# Patient Record
Sex: Female | Born: 1955 | Race: Black or African American | Hispanic: No | Smoking: Current every day smoker
Health system: Southern US, Community
[De-identification: ages and names within clinical notes are randomized; demographics above are authoritative.]

## PROBLEM LIST (undated history)

## (undated) ENCOUNTER — Emergency Department (HOSPITAL_COMMUNITY): Admission: EM | Payer: PRIVATE HEALTH INSURANCE | Source: Home / Self Care

## (undated) DIAGNOSIS — I10 Essential (primary) hypertension: Secondary | ICD-10-CM

## (undated) DIAGNOSIS — K219 Gastro-esophageal reflux disease without esophagitis: Secondary | ICD-10-CM

## (undated) DIAGNOSIS — E7800 Pure hypercholesterolemia, unspecified: Secondary | ICD-10-CM

## (undated) HISTORY — PX: KNEE ARTHROSCOPY: SHX127

---

## 2002-06-18 ENCOUNTER — Encounter: Payer: Self-pay | Admitting: Emergency Medicine

## 2002-06-18 ENCOUNTER — Emergency Department (HOSPITAL_COMMUNITY): Admission: EM | Admit: 2002-06-18 | Discharge: 2002-06-18 | Payer: Self-pay | Admitting: Emergency Medicine

## 2004-03-17 ENCOUNTER — Emergency Department (HOSPITAL_COMMUNITY): Admission: EM | Admit: 2004-03-17 | Discharge: 2004-03-18 | Payer: Self-pay

## 2005-11-24 ENCOUNTER — Emergency Department (HOSPITAL_COMMUNITY): Admission: EM | Admit: 2005-11-24 | Discharge: 2005-11-24 | Payer: Self-pay | Admitting: Emergency Medicine

## 2006-01-20 ENCOUNTER — Ambulatory Visit (HOSPITAL_COMMUNITY): Admission: RE | Admit: 2006-01-20 | Discharge: 2006-01-20 | Payer: Self-pay | Admitting: Family Medicine

## 2009-03-03 ENCOUNTER — Other Ambulatory Visit: Payer: Self-pay | Admitting: Emergency Medicine

## 2009-03-03 ENCOUNTER — Inpatient Hospital Stay (HOSPITAL_COMMUNITY): Admission: EM | Admit: 2009-03-03 | Discharge: 2009-03-07 | Payer: Self-pay | Admitting: Cardiology

## 2009-03-06 LAB — HEMOGLOBIN A1C: Hemoglobin A1C: 7.3

## 2011-03-21 LAB — BASIC METABOLIC PANEL
BUN: 5 mg/dL — ABNORMAL LOW (ref 6–23)
CO2: 25 mEq/L (ref 19–32)
CO2: 26 mEq/L (ref 19–32)
Calcium: 9 mg/dL (ref 8.4–10.5)
Calcium: 8.6 mg/dL (ref 8.4–10.5)
Calcium: 8.7 mg/dL (ref 8.4–10.5)
Calcium: 8.9 mg/dL (ref 8.4–10.5)
Chloride: 110 mEq/L (ref 96–112)
Creatinine, Ser: 0.77 mg/dL (ref 0.4–1.2)
Creatinine, Ser: 0.73 mg/dL (ref 0.4–1.2)
Creatinine, Ser: 0.77 mg/dL (ref 0.4–1.2)
GFR calc Af Amer: >60 mL/min (ref >60)
GFR calc Af Amer: >60 mL/min (ref >60)
GFR calc Af Amer: >60 mL/min (ref >60)
GFR calc Af Amer: >60 mL/min (ref >60)
GFR calc non Af Amer: >60 mL/min (ref >60)
GFR calc non Af Amer: >60 mL/min (ref >60)
GFR calc non Af Amer: >60 mL/min (ref >60)
GFR calc non Af Amer: >60 mL/min (ref >60)
Glucose, Bld: 100 mg/dL — ABNORMAL HIGH (ref 70–99)
Glucose, Bld: 104 mg/dL — ABNORMAL HIGH (ref 70–99)
Glucose, Bld: 111 mg/dL — ABNORMAL HIGH (ref 70–99)
Glucose, Bld: 116 mg/dL — ABNORMAL HIGH (ref 70–99)
Potassium: 3.6 mEq/L (ref 3.5–5.1)
Potassium: 3.7 mEq/L (ref 3.5–5.1)
Sodium: 140 mEq/L (ref 135–145)
Sodium: 135 mEq/L (ref 135–145)
Sodium: 140 mEq/L (ref 135–145)

## 2011-03-21 LAB — CBC
Hemoglobin: 13.4 g/dL (ref 12.0–15.0)
Hemoglobin: 11.9 g/dL — ABNORMAL LOW (ref 12.0–15.0)
Hemoglobin: 12.4 g/dL (ref 12.0–15.0)
MCHC: 31.9 g/dL (ref 30.0–36.0)
MCHC: 32 g/dL (ref 30.0–36.0)
MCHC: 32.1 g/dL (ref 30.0–36.0)
MCHC: 32.5 g/dL (ref 30.0–36.0)
MCV: 76.7 fL — ABNORMAL LOW (ref 78.0–100.0)
MCV: 77.2 fL — ABNORMAL LOW (ref 78.0–100.0)
MCV: 77.5 fL — ABNORMAL LOW (ref 78.0–100.0)
Platelets: 192 10*3/uL (ref 150–400)
Platelets: 223 10*3/uL (ref 150–400)
Platelets: 235 10*3/uL (ref 150–400)
RBC: 5.44 MIL/uL — ABNORMAL HIGH (ref 3.87–5.11)
RBC: 4.8 MIL/uL (ref 3.87–5.11)
RBC: 5.12 MIL/uL — ABNORMAL HIGH (ref 3.87–5.11)
RDW: 15.3 % (ref 11.5–15.5)
RDW: 15.1 % (ref 11.5–15.5)
RDW: 15.7 % — ABNORMAL HIGH (ref 11.5–15.5)

## 2011-03-21 LAB — COMPREHENSIVE METABOLIC PANEL
ALT: 11 U/L (ref 0–35)
BUN: 7 mg/dL (ref 6–23)
CO2: 29 mEq/L (ref 19–32)
Calcium: 8.5 mg/dL (ref 8.4–10.5)
Creatinine, Ser: 0.71 mg/dL (ref 0.4–1.2)
GFR calc non Af Amer: >60 mL/min (ref >60)
Glucose, Bld: 126 mg/dL — ABNORMAL HIGH (ref 70–99)
Sodium: 139 mEq/L (ref 135–145)
Total Protein: 6.6 g/dL (ref 6.0–8.3)

## 2011-03-21 LAB — DIFFERENTIAL
Basophils Absolute: 0.1 10*3/uL (ref 0.0–0.1)
Basophils Relative: 1 % (ref 0–1)
Lymphocytes Relative: 49 % — ABNORMAL HIGH (ref 12–46)
Lymphs Abs: 3.3 10*3/uL (ref 0.7–4.0)
Monocytes Absolute: 0.5 10*3/uL (ref 0.1–1.0)
Monocytes Absolute: 0.5 10*3/uL (ref 0.1–1.0)
Monocytes Relative: 6 % (ref 3–12)
Monocytes Relative: 8 % (ref 3–12)
Neutro Abs: 3.3 10*3/uL (ref 1.7–7.7)
Neutro Abs: 2.9 10*3/uL (ref 1.7–7.7)
Neutrophils Relative %: 42 % — ABNORMAL LOW (ref 43–77)
Neutrophils Relative %: 42 % — ABNORMAL LOW (ref 43–77)

## 2011-03-21 LAB — POCT CARDIAC MARKERS
CKMB, poc: 1.8 ng/mL (ref 1.0–8.0)
Myoglobin, poc: 149 ng/mL (ref 12–200)

## 2011-03-21 LAB — CARDIAC PANEL(CRET KIN+CKTOT+MB+TROPI)
CK, MB: 1.5 ng/mL (ref 0.3–4.0)
Total CK: 143 U/L (ref 7–177)
Troponin I: <0.01 ng/mL (ref 0.00–0.06)

## 2011-03-21 LAB — HEMOGLOBIN A1C
Hgb A1c MFr Bld: 7.3 % — ABNORMAL HIGH (ref 4.6–6.1)
Mean Plasma Glucose: 163 mg/dL

## 2011-03-21 LAB — TROPONIN I: Troponin I: 0.01 ng/mL (ref 0.00–0.06)

## 2011-03-21 LAB — GLUCOSE, CAPILLARY: Glucose-Capillary: 126 mg/dL — ABNORMAL HIGH (ref 70–99)

## 2011-03-21 LAB — IRON AND TIBC: UIBC: 261 ug/dL

## 2011-03-21 LAB — TSH: TSH: 0.597 u[IU]/mL (ref 0.350–4.500)

## 2011-03-21 LAB — CK TOTAL AND CKMB (NOT AT ARMC)
CK, MB: 3.3 ng/mL (ref 0.3–4.0)
Total CK: 285 U/L — ABNORMAL HIGH (ref 7–177)

## 2011-03-21 LAB — PROTIME-INR
INR: 1.1 (ref 0.00–1.49)
Prothrombin Time: 14.2 seconds (ref 11.6–15.2)

## 2011-03-21 LAB — HEPARIN LEVEL (UNFRACTIONATED): Heparin Unfractionated: 0.26 IU/mL — ABNORMAL LOW (ref 0.30–0.70)

## 2011-03-21 LAB — LIPID PANEL
Cholesterol: 154 mg/dL (ref 0–200)
LDL Cholesterol: 106 mg/dL — ABNORMAL HIGH (ref 0–99)

## 2011-03-21 LAB — APTT: aPTT: 60 seconds — ABNORMAL HIGH (ref 24–37)

## 2011-04-23 NOTE — Cardiovascular Report (Signed)
[name], ARBAUGH NO.:  [id number]   MEDICAL RECORD NO.:  [id number]          PATIENT TYPE:  INP   LOCATION:  3739                         FACILITY:  [facility]   PHYSICIAN:  Nicki Guadalajara, M.D.     DATE OF BIRTH:  22-Apr-1956   DATE OF PROCEDURE:  DATE OF DISCHARGE:                            CARDIAC CATHETERIZATION   INDICATIONS:  Ms. Breanne Olvera is a 55 year old African American female  who has a history of hypertension, tobacco history, and significant  family history for coronary artery disease.  She was admitted to [facility] on transfer from [facility] with chest pain.  She  was admitted by Dr. Garen Lah in light of her chest pain symptomatology  with significant cardiac risk factors and definitive cardiac  catheterization was recommended.   PROCEDURE:  After premedication with Valium and Benadryl on the floor,  the patient was prepped and draped in the usual fashion.  The right  femoral artery was punctured anteriorly and a 5-French arterial sheath  was inserted without difficulty.  Diagnostic cardiac catheterization was  done utilizing 5-French Judkins for left and right coronary catheters.  A 5-French pigtail catheter was used for RAO ventriculography.  Distal  aortography was also performed to evaluate her renal artery to make sure  she did not have any renovascular etiology to her hypertension.  Hemostasis was obtained by direct manual pressure.  She tolerated the  procedure well.   HEMODYNAMIC DATA:  Central aortic pressure was 154/69.  Left ventricular  pressure 154/19.   ANGIOGRAPHIC DATA:  The left main coronary artery was angiographically  normal and bifurcated into the LAD and left circumflex system.   The LAD was angiographically normal and gave rise to two diagonal  vessels and wrapped around the LV apex.   The circumflex vessel had mild 20% narrowing just proximal to giving  rise to the OM1 vessel.   The right coronary  artery was a moderate-sized vessel that gave rise to  a PDA and PLA system.  There was mild 20% narrowing in the mid segment.   RAO ventriculography revealed normal LV contractility without focal  segmental wall motion abnormalities.  There was no evidence for mitral  regurgitation.   Distal aortography revealed a mildly tortuous infrarenal aorta.  There  was no evidence for stenosis.  The renal arteries were widely patent.  She otherwise has normal aortoiliac system.   IMPRESSION:  1. Normal left ventricular function.  2. Mild nonobstructive coronary artery disease with a smooth 20%      narrowing in the proximal circumflex and smooth 20% narrowing in      the mid right coronary artery.   RECOMMENDATIONS:  Medical therapy with reference to aggressive lifestyle  and cardiac risk factor modification with particular importance to  smoking cessation.           ______________________________  Nicki Guadalajara, M.D.     TK/MEDQ  D:  03/06/2009  T:  03/07/2009  Job:  [id number]   cc:   Sheliah Mends, MD  [facility]

## 2011-04-23 NOTE — Discharge Summary (Signed)
NAMEJAYLIANI, WANNER NO.:  [id number]   MEDICAL RECORD NO.:  [id number]          PATIENT TYPE:  INP   LOCATION:  3739                         FACILITY:  [facility]   PHYSICIAN:  Nicki Guadalajara, M.D.     DATE OF BIRTH:  12/08/1956   DATE OF ADMISSION:  03/03/2009  DATE OF DISCHARGE:  03/07/2009                               DISCHARGE SUMMARY   Ms. [name] is a 55 year old female who apparently was having chest  pain for 2 months off and on.  Her symptoms were worse over the past  week.  She was seen by her primary care doctor to refill her  hydrochlorothiazide for her high blood pressure and she was sent to the  emergency room.  She was seen in the ER by the ER doctors and  transferred to [facility] for further cardiac workup.  It was  felt that her chest pain was worrisome for unstable angina plus she has  hypertension and tobacco smoking and family history of heart disease  prematurely.  She was placed on IV heparin.  Her CK-MBs came back  negative.  Her potassium was very low on admission at 2.5.  This was  repleted.  She underwent cardiac catheterization on March 06, 2009, by  Dr. Tresa Endo.  She had 20% in her RCA and 20% in her mid circumflex.  On  March 07, 2009, she was seen by Dr. Tresa Endo, considered stable for  discharge home.  Her blood pressure was 113/64.  She was sinus brady 46-  50.  She was not given any beta-blockers secondary to this.  Her  __________ was 20.  Her temperature was 97.9.  Her labs showed on the  day of discharge sodium of 141, potassium 3.8, BUN 5, creatinine 0.73,  and glucose was 111.  Her hemoglobin 11.1, hematocrit 34.8, and  platelets were 192, and WBCs 5.5.  CK-MBs and troponins were all  negative.  Her TSH was 0.597.  Hemoglobin A1c was 7.3.  Lipid profile  showed a total cholesterol of 154, triglycerides 111, HDL 26, and LDL is  106.   DISCHARGE MEDICATIONS:  1. Hydrochlorothiazide 25 mg once a day.  2. Pepcid 20 mg  daily.  3. Prevacid 20 mg daily at bedtime.  4. K-Dur 20 mEq a day.  5. Diabetes mellitus, questionable new.  We will refer her back to her      primary care physician.  She states she has been evaluated for this      by her primary care physician.  However, her hemoglobin A1c is now      7.3.  She has not been taught anything about the diet, etc.  Again,      we will refer her back to her primary care physician at [facility].  She was made aware of this.  6. Microcytic anemia.  Her MCV is 76.7.  She probably requires iron      studies.  This can also be done as an outpatient.      Lezlie Octave,  N.P.    ______________________________  Nicki Guadalajara, M.D.    BB/MEDQ  D:  03/07/2009  T:  03/08/2009  Job:  [id number]   cc:   [facility]

## 2011-04-23 NOTE — H&P (Signed)
[name], [name]                ACCOUNT NO.:  [id number]   MEDICAL RECORD NO.:  [id number]          PATIENT TYPE:  INP   LOCATION:  2920                         FACILITY:  [facility]   PHYSICIAN:  Sheliah Mends, MD      DATE OF BIRTH:  Dec 10, 1955   DATE OF ADMISSION:  03/03/2009  DATE OF DISCHARGE:                              HISTORY & PHYSICAL   CHIEF COMPLAINT:  Chest pain.   HISTORY OF PRESENT ILLNESS:  [name] is a 55 year old female who was  transferred from [facility].  She originally presented to her  primary care doctor at [facility].  She needed a  refill on her blood pressure medicine prescription.  She mentioned to  her [name] there that she had been having some substernal chest pain  for about 2 months off and on.  She says her symptoms were worse this  past week.  She describes a substernal chest heaviness which seems to be  worse with exertion.  She does say it radiates to her arm and into her  mid back.  Her primary care [name] sent her to [facility] ER.  Initial  enzymes are negative.  After discussion with Dr. Garen Lah over the  phone, the patient is transferred to [facility].  She still having some minor  amount of pain on heparin and nitroglycerin.  Her EKG shows nonspecific  ST changes.  She denies any associated shortness of breath or  diaphoresis but may have had some associated nausea with this.   PAST MEDICAL HISTORY:  Remarkable for treated hypertension.  She has had  a remote C-section.   CURRENT MEDICATIONS:  1. HCTZ.  2. Aspirin 325 daily.   ALLERGIES:  No known drug allergies.   SOCIAL HISTORY:  She is divorced.  She is three children.  She is a  Scientist, product/process development.  She smokes about a pack a day.   FAMILY HISTORY:  Remarkable that her mother, her sister and 2 brothers  all have coronary disease which came on their 6s.   REVIEW OF SYSTEMS:  Essentially unremarkable except for above.  She  denies any GI bleeding or melena.   She says at one time a physician told  her she had some thyroid issues but follow up by another physician said  that he did not think this was a problem.   PHYSICAL EXAMINATION:  Blood pressure 120/68, pulse 52, respirations 12.  GENERAL:  She is a well-developed, overweight African American female in  no acute distress.  HEENT: Normocephalic, atraumatic.  Extraocular movements intact.  Sclerae not icteric.  Lids and conjunctivae within normal limits.  NECK:  Without JVD or bruit.  CHEST: Clear to auscultation and percussion.  CARDIAC:  Reveals regular rate and rhythm without murmur or gallop.  Normal S1 and  S2.  ABDOMEN:  Obese, nontender.  No hepatosplenomegaly.  No bruits.  EXTREMITIES: Without edema.  Distal pulses are 3+/4 bilaterally.  NEURO:  Exam is grossly intact.  She is awake, alert and oriented,  operative.  Moves all extremities without obvious deficit.  SKIN:  Warm  and dry.   EKG shows sinus rhythm without acute changes.   LABORATORY DATA:  White count 7.9, hemoglobin 13.4, hematocrit 42.2,  platelets 242.  Sodium was 140, potassium was 2.5, BUN 8, creatinine  0.7, troponin is negative.  BNP less than 30.   IMPRESSION:  1. Chest pain with multiple cardiac risk factors, worrisome for      unstable angina.  2. Treated hypertension.  3. Family history of coronary disease.  4. Smoking.  5. Unknown lipid status.  6. Hypokalemia.  I believe her potassium was replaced up at [facility]      but this will be double-check.   PLAN:  Will continue heparin, nitrates and aspirin.  Hold off on a beta  blocker as her heart rate is 52.  Will add a PPI.  She will probably  need diagnostic catheterization.  This will be discussed with Dr.  Garen Lah.      [name], P.A.      Sheliah Mends, MD  Electronically Signed    LKK/MEDQ  D:  03/04/2009  T:  03/04/2009  Job:  [id number]

## 2012-07-16 NOTE — H&P (Signed)
  NTS SOAP Note  Vital Signs:  Vitals as of: 07/16/2012: Systolic 164: Diastolic 95: Heart Rate 67: Temp 97.39F: Height 50ft 9in: Weight 218Lbs 0 Ounces: BMI 32  BMI : 32.19 kg/m2  Subjective: This 56 Years 1 Months old Female presents for rectal issues.  Has had some protrusion of the rectum recently.  Denies bleeding, significant constipation.   Review of Symptoms:  Constitutional:  fatigue Head:unremarkable    Eyes:unremarkable   Nose/Mouth/Throat:unremarkable Cardiovascular:  unremarkable   Respiratory:  wheezing Gastrointestinal:  unremarkable   Genitourinary:unremarkable     Musculoskeletal:unremarkable   Skin:unremarkable Hematolgic/Lymphatic:unremarkable     Allergic/Immunologic:unremarkable     Past Medical History:    Reviewed   Past Medical History  Surgical History: c-section Medical Problems:  Diabetes, High cholesterol Allergies: nkda Medications: metformin, pravastatin, HCTZ, baby asa   Social History:Reviewed  Social History  Preferred Language: English ([place]) Ethnicity: Not Hispanic / Latino Age: 56 Years 7 Months Alcohol:  No Recreational drug(s):  No   Smoking Status: Current every day smoker reviewed on 07/16/2012 Started Date: 12/09/1978 Packs per day: 1.00   Family History:  Reviewed   Family History  Is there a family history of:HTN, DM    Objective Information: General:  Well appearing, well nourished in no distress. Neck:  Supple without lymphadenopathy.  Heart:  RRR, no murmur Lungs:    CTA bilaterally, no wheezes, rhonchi, rales.  Breathing unlabored. Abdomen:Soft, NT/ND, no HSM, no masses.   Circumferential prolapse of mucosa noted.  No specific mass.  Tone fair.  Heme negative.  Assessment:Rectal prolapse  Diagnosis &amp; Procedure: DiagnosisCode: 569.1, ProcedureCode: 09811,    Plan:Scheduled for TCS on 07/24/12.  Needs screening TCS.  Will  manage prolapse pending TCS results.   Patient Education:Alternative treatments to surgery were discussed with patient (and family).  Risks and benefits  of procedure were fully explained to the patient (and family) who gave informed consent. Patient/family questions were addressed.  Follow-up:Pending Surgery

## 2012-07-17 ENCOUNTER — Encounter (HOSPITAL_COMMUNITY): Payer: Self-pay | Admitting: Pharmacy Technician

## 2012-07-24 ENCOUNTER — Encounter (HOSPITAL_COMMUNITY)
Admission: RE | Disposition: A | Discharge status: Home / Self Care | Payer: Self-pay | Source: Ambulatory Visit | Attending: General Surgery

## 2012-07-24 ENCOUNTER — Encounter (HOSPITAL_COMMUNITY): Payer: Self-pay

## 2012-07-24 ENCOUNTER — Ambulatory Visit (HOSPITAL_COMMUNITY)
Admission: RE | Admit: 2012-07-24 | Discharge: 2012-07-24 | Disposition: A | Discharge status: Home / Self Care | Payer: PRIVATE HEALTH INSURANCE | Source: Ambulatory Visit | Attending: General Surgery | Admitting: General Surgery

## 2012-07-24 DIAGNOSIS — E119 Type 2 diabetes mellitus without complications: Secondary | ICD-10-CM | POA: Insufficient documentation

## 2012-07-24 DIAGNOSIS — K644 Residual hemorrhoidal skin tags: Secondary | ICD-10-CM | POA: Insufficient documentation

## 2012-07-24 DIAGNOSIS — Z1211 Encounter for screening for malignant neoplasm of colon: Secondary | ICD-10-CM | POA: Insufficient documentation

## 2012-07-24 DIAGNOSIS — E7800 Pure hypercholesterolemia, unspecified: Secondary | ICD-10-CM | POA: Insufficient documentation

## 2012-07-24 DIAGNOSIS — Z01812 Encounter for preprocedural laboratory examination: Secondary | ICD-10-CM | POA: Insufficient documentation

## 2012-07-24 HISTORY — DX: Gastro-esophageal reflux disease without esophagitis: K21.9

## 2012-07-24 HISTORY — DX: Pure hypercholesterolemia, unspecified: E78.00

## 2012-07-24 HISTORY — PX: COLONOSCOPY: SHX5424

## 2012-07-24 HISTORY — DX: Essential (primary) hypertension: I10

## 2012-07-24 SURGERY — COLONOSCOPY
Anesthesia: Moderate Sedation

## 2012-07-24 MED ORDER — MEPERIDINE HCL 50 MG/ML IJ SOLN
INTRAMUSCULAR | Status: AC
  Filled 2012-07-24: qty 1

## 2012-07-24 MED ORDER — SODIUM CHLORIDE 0.9 % IV SOLN
INTRAVENOUS | Status: DC
  Administered 2012-07-24: 07:00:00 via INTRAVENOUS

## 2012-07-24 MED ORDER — MIDAZOLAM HCL 5 MG/5ML IJ SOLN
INTRAMUSCULAR | Status: DC | PRN
  Administered 2012-07-24: 1 mg via INTRAVENOUS
  Administered 2012-07-24: 3 mg via INTRAVENOUS

## 2012-07-24 MED ORDER — MIDAZOLAM HCL 5 MG/5ML IJ SOLN
INTRAMUSCULAR | Status: AC
  Filled 2012-07-24: qty 10

## 2012-07-24 MED ORDER — MEPERIDINE HCL 25 MG/ML IJ SOLN
INTRAMUSCULAR | Status: DC | PRN
  Administered 2012-07-24: 50 mg via INTRAVENOUS

## 2012-07-24 MED ORDER — STERILE WATER FOR IRRIGATION IR SOLN
Status: DC | PRN
  Administered 2012-07-24: 08:00:00

## 2012-07-24 NOTE — Op Note (Signed)
[facility] [address] [place], [place]  [address]  COLONOSCOPY PROCEDURE REPORT  PATIENT:  [name], [name]  MR#:  [id number] BIRTHDATE:  1956-06-19, 55 yrs. old  GENDER:  female ENDOSCOPIST:  Franky Macho, MD REF. BY:  Reynolds Bowl, M.D. PROCEDURE DATE:  07/24/2012 PROCEDURE:  Average-risk screening colonoscopy G0121 ASA CLASS:  Class II INDICATIONS:  Screening MEDICATIONS:   Versed 4 mg IV, demerol 50 mg IV  DESCRIPTION OF PROCEDURE:   After the risks benefits and alternatives of the procedure were thoroughly explained, informed consent was obtained.  Digital rectal exam was performed and revealed rectal prolapse.   The EC-[id number] ([id number]) endoscope was introduced through the anus and advanced to the cecum, which was identified by both the appendix and ileocecal valve, without limitations.  The quality of the prep was adequate..  The instrument was then slowly withdrawn as the colon was fully examined.  FINDINGS:  A normal appearing cecum, ileocecal valve, and appendiceal orifice were identified. The ascending, hepatic flexure, transverse, splenic flexure, descending, sigmoid colon, and rectum appeared unremarkable.   Retroflexed views in the rectum revealed internal and external hemorrhoids.    The time to cecum =  1) 10  minutes. The scope was then withdrawn in  1) 4 minutes from the cecum and the procedure completed. COMPLICATIONS:  None ENDOSCOPIC IMPRESSION: 1) Normal colon 2) Internal and external hemorrhoids RECOMMENDATIONS:  May need surgery for rectal prolapse.  Will discuss with patient. REPEAT EXAM:  In 10 year(s) for Colonoscopy.  ______________________________ Franky Macho, MD  CC:  Reynolds Bowl MD  n. Rosalie DoctorFranky Macho at 07/24/2012 08:01 AM  Delman Kitten, [phone]

## 2012-07-24 NOTE — Interval H&P Note (Signed)
History and Physical Interval Note:  07/24/2012 7:22 AM  [name]  has presented today for surgery, with the diagnosis of Rectal bleed   The various methods of treatment have been discussed with the patient and family. After consideration of risks, benefits and other options for treatment, the patient has consented to  Procedure(s) (LRB): COLONOSCOPY (N/A) as a surgical intervention .  The patient's history has been reviewed, patient examined, no change in status, stable for surgery.  I have reviewed the patient's chart and labs.  Questions were answered to the patient's satisfaction.     Franky Macho A

## 2012-07-30 ENCOUNTER — Encounter (HOSPITAL_COMMUNITY): Payer: Self-pay | Admitting: General Surgery

## 2012-09-03 NOTE — H&P (Signed)
  NTS SOAP Note  Vital Signs:  Vitals as of: 09/04/2012: Systolic 164: Diastolic 95: Heart Rate 67: Temp 97.13F: Height 32ft 9in: Weight 218Lbs 0 Ounces: BMI 32  BMI : 32.19 kg/m2  Subjective: This 58 Years 36 Months old Female presents for rectal issues.  Has had some protrusion of the rectum recently.  Denies bleeding, significant constipation.   Review of Symptoms:  Constitutional:  fatigue Head:unremarkable    Eyes:unremarkable   Nose/Mouth/Throat:unremarkable Cardiovascular:  unremarkable   Respiratory:  wheezing Gastrointestinal:  unremarkable   Genitourinary:unremarkable     Musculoskeletal:unremarkable   Skin:unremarkable Hematolgic/Lymphatic:unremarkable     Allergic/Immunologic:unremarkable     Past Medical History:    Reviewed   Past Medical History  Surgical History: c-section Medical Problems:  Diabetes, High cholesterol Allergies: nkda Medications: metformin, pravastatin, HCTZ, baby asa   Social History:Reviewed  Social History  Preferred Language: English ([place]) Ethnicity: Not Hispanic / Latino Age: 35 Years 7 Months Alcohol:  No Recreational drug(s):  No   Smoking Status: Current every day smoker reviewed on 07/16/2012 Started Date: 12/09/1978 Packs per day: 1.00   Family History:  Reviewed   Family History  Is there a family history of:HTN, DM    Objective Information: General:  Well appearing, well nourished in no distress. Neck:  Supple without lymphadenopathy.  Heart:  RRR, no murmur Lungs:    CTA bilaterally, no wheezes, rhonchi, rales.  Breathing unlabored. Abdomen:Soft, NT/ND, no HSM, no masses.   Circumferential prolapse of mucosa noted.  No specific mass.  Tone fair.  Heme negative.  Assessment:Rectal prolapse   Plan:Scheduled for proctoplasty on 09/18/12.  Patient Education:Alternative treatments to surgery were discussed with patient (and  family).  Risks and benefits  of procedure were fully explained to the patient (and family) who gave informed consent. Patient/family questions were addressed.  Follow-up:Pending Surgery

## 2012-09-10 NOTE — Patient Instructions (Addendum)
20 BIJAL SIGLIN  09/10/2012   Your procedure is scheduled on:  Friday, 09/18/12  [facility] to [facility] [place] [facility].  Call this number if you have problems the morning of surgery: [phone]   Remember:   Do not eat food:After Midnight.  May have clear liquids:until Midnight .  Clear liquids include soda, tea, black coffee, apple or grape juice, broth.  Take these medicines the morning of surgery with A SIP OF WATER:    Do not wear jewelry, make-up or nail polish.  Do not wear lotions, powders, or perfumes. You may wear deodorant.  Do not shave 48 hours prior to surgery. Men may shave face and neck.  Do not bring valuables to the hospital.  Contacts, dentures or bridgework may not be worn into surgery.  Leave suitcase in the car. After surgery it may be brought to your room.  For patients admitted to the hospital, checkout time is 11:00 AM the day of discharge.   Patients discharged the day of surgery will not be allowed to drive home.  Name and phone number of your driver: family  Special Instructions: Shower using CHG 2 nights before surgery and the night before surgery.  If you shower the day of surgery use CHG.  Use special wash - you have one bottle of CHG for all showers.  You should use approximately 1/3 of the bottle for each shower.   Please read over the following fact sheets that you were given: Pain Booklet, MRSA Information, Surgical Site Infection Prevention, Anesthesia Post-op Instructions and Care and Recovery After Surgery  PATIENT INSTRUCTIONS POST-ANESTHESIA  IMMEDIATELY FOLLOWING SURGERY:  Do not drive or operate machinery for the first twenty four hours after surgery.  Do not make any important decisions for twenty four hours after surgery or while taking narcotic pain medications or sedatives.  If you develop intractable nausea and vomiting or a severe headache please notify your doctor immediately.  FOLLOW-UP:  Please make an appointment with your surgeon as  instructed. You do not need to follow up with anesthesia unless specifically instructed to do so.  WOUND CARE INSTRUCTIONS (if applicable):  Keep a dry clean dressing on the anesthesia/puncture wound site if there is drainage.  Once the wound has quit draining you may leave it open to air.  Generally you should leave the bandage intact for twenty four hours unless there is drainage.  If the epidural site drains for more than 36-48 hours please call the anesthesia department.  QUESTIONS?:  Please feel free to call your physician or the hospital operator if you have any questions, and they will be happy to assist you.

## 2012-09-11 ENCOUNTER — Encounter (HOSPITAL_COMMUNITY): Payer: Self-pay

## 2012-09-11 ENCOUNTER — Encounter (HOSPITAL_COMMUNITY)
Admission: RE | Admit: 2012-09-11 | Discharge: 2012-09-11 | Disposition: A | Discharge status: Home / Self Care | Payer: PRIVATE HEALTH INSURANCE | Source: Ambulatory Visit | Attending: General Surgery | Admitting: General Surgery

## 2012-09-11 ENCOUNTER — Encounter (HOSPITAL_COMMUNITY): Payer: Self-pay | Admitting: Pharmacy Technician

## 2012-09-11 LAB — CBC WITH DIFFERENTIAL/PLATELET
Basophils Relative: 0 % (ref 0–1)
Eosinophils Relative: 2 % (ref 0–5)
Hemoglobin: 13.7 g/dL (ref 12.0–15.0)
Lymphs Abs: 3.4 10*3/uL (ref 0.7–4.0)
MCH: 24.7 pg — ABNORMAL LOW (ref 26.0–34.0)
MCV: 74.9 fL — ABNORMAL LOW (ref 78.0–100.0)
Monocytes Absolute: 0.4 10*3/uL (ref 0.1–1.0)
Neutrophils Relative %: 41 % — ABNORMAL LOW (ref 43–77)
RBC: 5.54 MIL/uL — ABNORMAL HIGH (ref 3.87–5.11)

## 2012-09-11 LAB — BASIC METABOLIC PANEL
CO2: 30 mEq/L (ref 19–32)
Glucose, Bld: 123 mg/dL — ABNORMAL HIGH (ref 70–99)
Potassium: 3.6 mEq/L (ref 3.5–5.1)
Sodium: 139 mEq/L (ref 135–145)

## 2012-09-18 ENCOUNTER — Encounter (HOSPITAL_COMMUNITY)
Admission: RE | Disposition: A | Discharge status: Home / Self Care | Payer: Self-pay | Source: Ambulatory Visit | Attending: General Surgery

## 2012-09-18 ENCOUNTER — Ambulatory Visit (HOSPITAL_COMMUNITY): Payer: PRIVATE HEALTH INSURANCE | Admitting: Anesthesiology

## 2012-09-18 ENCOUNTER — Encounter (HOSPITAL_COMMUNITY): Payer: Self-pay | Admitting: Anesthesiology

## 2012-09-18 ENCOUNTER — Ambulatory Visit (HOSPITAL_COMMUNITY)
Admission: RE | Admit: 2012-09-18 | Discharge: 2012-09-18 | Disposition: A | Discharge status: Home / Self Care | Payer: PRIVATE HEALTH INSURANCE | Source: Ambulatory Visit | Attending: General Surgery | Admitting: General Surgery

## 2012-09-18 ENCOUNTER — Encounter (HOSPITAL_COMMUNITY): Payer: Self-pay | Admitting: *Deleted

## 2012-09-18 DIAGNOSIS — E119 Type 2 diabetes mellitus without complications: Secondary | ICD-10-CM | POA: Insufficient documentation

## 2012-09-18 DIAGNOSIS — E785 Hyperlipidemia, unspecified: Secondary | ICD-10-CM | POA: Insufficient documentation

## 2012-09-18 DIAGNOSIS — K623 Rectal prolapse: Secondary | ICD-10-CM | POA: Insufficient documentation

## 2012-09-18 DIAGNOSIS — Z01812 Encounter for preprocedural laboratory examination: Secondary | ICD-10-CM | POA: Insufficient documentation

## 2012-09-18 DIAGNOSIS — I10 Essential (primary) hypertension: Secondary | ICD-10-CM | POA: Insufficient documentation

## 2012-09-18 LAB — GLUCOSE, CAPILLARY
Glucose-Capillary: 154 mg/dL — ABNORMAL HIGH (ref 70–99)
Glucose-Capillary: 124 mg/dL — ABNORMAL HIGH (ref 70–99)

## 2012-09-18 SURGERY — PROCTOPLASTY
Anesthesia: General | Wound class: Clean Contaminated

## 2012-09-18 MED ORDER — ENOXAPARIN SODIUM 40 MG/0.4ML ~~LOC~~ SOLN
SUBCUTANEOUS | Status: AC
  Filled 2012-09-18: qty 0.4

## 2012-09-18 MED ORDER — PROMETHAZINE HCL 25 MG/ML IJ SOLN
INTRAMUSCULAR | Status: AC
  Filled 2012-09-18: qty 1

## 2012-09-18 MED ORDER — HYDROCODONE-ACETAMINOPHEN 5-325 MG PO TABS: 1.0000 | ORAL_TABLET | ORAL | Status: DC | PRN

## 2012-09-18 MED ORDER — LIDOCAINE VISCOUS 2 % MT SOLN
OROMUCOSAL | Status: AC
  Filled 2012-09-18: qty 15

## 2012-09-18 MED ORDER — FENTANYL CITRATE 0.05 MG/ML IJ SOLN
INTRAMUSCULAR | Status: DC | PRN
  Administered 2012-09-18 (×5): 50 ug via INTRAVENOUS

## 2012-09-18 MED ORDER — KETOROLAC TROMETHAMINE 30 MG/ML IJ SOLN
INTRAMUSCULAR | Status: AC
  Filled 2012-09-18: qty 1

## 2012-09-18 MED ORDER — MIDAZOLAM HCL 2 MG/2ML IJ SOLN
1.0000 mg | INTRAMUSCULAR | Status: DC | PRN
  Administered 2012-09-18: 2 mg via INTRAVENOUS

## 2012-09-18 MED ORDER — PROMETHAZINE HCL 25 MG/ML IJ SOLN: 12.5000 mg | Freq: Once | INTRAMUSCULAR | Status: DC

## 2012-09-18 MED ORDER — LIDOCAINE HCL 1 % IJ SOLN
INTRAMUSCULAR | Status: DC | PRN
  Administered 2012-09-18: 40 mg via INTRADERMAL

## 2012-09-18 MED ORDER — ROCURONIUM BROMIDE 100 MG/10ML IV SOLN
INTRAVENOUS | Status: DC | PRN
  Administered 2012-09-18: 5 mg via INTRAVENOUS
  Administered 2012-09-18: 20 mg via INTRAVENOUS

## 2012-09-18 MED ORDER — BUPIVACAINE HCL (PF) 0.5 % IJ SOLN
INTRAMUSCULAR | Status: AC
  Filled 2012-09-18: qty 30

## 2012-09-18 MED ORDER — ONDANSETRON HCL 4 MG/2ML IJ SOLN
4.0000 mg | Freq: Once | INTRAMUSCULAR | Status: AC | PRN
  Administered 2012-09-18: 4 mg via INTRAVENOUS

## 2012-09-18 MED ORDER — ONDANSETRON HCL 4 MG/2ML IJ SOLN
4.0000 mg | Freq: Once | INTRAMUSCULAR | Status: AC
  Administered 2012-09-18: 4 mg via INTRAVENOUS

## 2012-09-18 MED ORDER — GLYCOPYRROLATE 0.2 MG/ML IJ SOLN
INTRAMUSCULAR | Status: DC | PRN
  Administered 2012-09-18: .4 mg via INTRAVENOUS

## 2012-09-18 MED ORDER — LACTATED RINGERS IV SOLN
INTRAVENOUS | Status: DC
  Administered 2012-09-18: 1000 mL via INTRAVENOUS

## 2012-09-18 MED ORDER — PROMETHAZINE HCL 25 MG/ML IJ SOLN
12.5000 mg | Freq: Once | INTRAMUSCULAR | Status: AC
  Administered 2012-09-18: 6.25 mg via INTRAVENOUS

## 2012-09-18 MED ORDER — NEOSTIGMINE METHYLSULFATE 1 MG/ML IJ SOLN
INTRAMUSCULAR | Status: DC | PRN
  Administered 2012-09-18: 2 mg via INTRAVENOUS

## 2012-09-18 MED ORDER — FENTANYL CITRATE 0.05 MG/ML IJ SOLN
25.0000 ug | INTRAMUSCULAR | Status: DC | PRN
  Administered 2012-09-18 (×4): 50 ug via INTRAVENOUS

## 2012-09-18 MED ORDER — SODIUM CHLORIDE 0.9 % IV SOLN
INTRAVENOUS | Status: AC
  Filled 2012-09-18: qty 1

## 2012-09-18 MED ORDER — ONDANSETRON HCL 4 MG/2ML IJ SOLN
INTRAMUSCULAR | Status: AC
  Filled 2012-09-18: qty 2

## 2012-09-18 MED ORDER — FENTANYL CITRATE 0.05 MG/ML IJ SOLN
INTRAMUSCULAR | Status: AC
  Filled 2012-09-18: qty 2

## 2012-09-18 MED ORDER — LIDOCAINE HCL (PF) 1 % IJ SOLN
INTRAMUSCULAR | Status: AC
  Filled 2012-09-18: qty 5

## 2012-09-18 MED ORDER — ENOXAPARIN SODIUM 40 MG/0.4ML ~~LOC~~ SOLN
40.0000 mg | Freq: Once | SUBCUTANEOUS | Status: AC
  Administered 2012-09-18: 40 mg via SUBCUTANEOUS

## 2012-09-18 MED ORDER — SODIUM CHLORIDE 0.9 % IR SOLN
Status: DC | PRN
  Administered 2012-09-18: 1000 mL

## 2012-09-18 MED ORDER — BUPIVACAINE HCL (PF) 0.5 % IJ SOLN
INTRAMUSCULAR | Status: DC | PRN
  Administered 2012-09-18: 9 mL

## 2012-09-18 MED ORDER — PROPOFOL 10 MG/ML IV EMUL
INTRAVENOUS | Status: AC
  Filled 2012-09-18: qty 20

## 2012-09-18 MED ORDER — SODIUM CHLORIDE 0.9 % IV SOLN
1.0000 g | INTRAVENOUS | Status: AC
  Administered 2012-09-18: 1 g via INTRAVENOUS

## 2012-09-18 MED ORDER — SUCCINYLCHOLINE CHLORIDE 20 MG/ML IJ SOLN
INTRAMUSCULAR | Status: AC
  Filled 2012-09-18: qty 1

## 2012-09-18 MED ORDER — KETOROLAC TROMETHAMINE 30 MG/ML IJ SOLN
30.0000 mg | Freq: Once | INTRAMUSCULAR | Status: AC
  Administered 2012-09-18: 30 mg via INTRAVENOUS

## 2012-09-18 MED ORDER — MIDAZOLAM HCL 2 MG/2ML IJ SOLN
INTRAMUSCULAR | Status: AC
  Filled 2012-09-18: qty 2

## 2012-09-18 MED ORDER — LIDOCAINE VISCOUS 2 % MT SOLN
OROMUCOSAL | Status: DC | PRN
  Administered 2012-09-18: 15 mL

## 2012-09-18 MED ORDER — PROPOFOL 10 MG/ML IV BOLUS
INTRAVENOUS | Status: DC | PRN
  Administered 2012-09-18: 160 mg via INTRAVENOUS

## 2012-09-18 MED ORDER — NEOSTIGMINE METHYLSULFATE 1 MG/ML IJ SOLN
INTRAMUSCULAR | Status: AC
  Filled 2012-09-18: qty 10

## 2012-09-18 MED ORDER — SODIUM CHLORIDE 0.9 % IJ SOLN
INTRAMUSCULAR | Status: AC
  Filled 2012-09-18: qty 10

## 2012-09-18 MED ORDER — GLYCOPYRROLATE 0.2 MG/ML IJ SOLN
INTRAMUSCULAR | Status: AC
  Filled 2012-09-18: qty 2

## 2012-09-18 MED ORDER — SUCCINYLCHOLINE CHLORIDE 20 MG/ML IJ SOLN
INTRAMUSCULAR | Status: DC | PRN
  Administered 2012-09-18: 120 mg via INTRAVENOUS

## 2012-09-18 MED ORDER — ROCURONIUM BROMIDE 50 MG/5ML IV SOLN
INTRAVENOUS | Status: AC
  Filled 2012-09-18: qty 1

## 2012-09-18 MED ORDER — FENTANYL CITRATE 0.05 MG/ML IJ SOLN
INTRAMUSCULAR | Status: AC
  Filled 2012-09-18: qty 5

## 2012-09-18 SURGICAL SUPPLY — 28 items
BAG HAMPER (MISCELLANEOUS) ×2 IMPLANT
CLOTH BEACON ORANGE TIMEOUT ST (SAFETY) ×2 IMPLANT
COVER LIGHT HANDLE STERIS (MISCELLANEOUS) ×4 IMPLANT
COVER MAYO STAND XLG (DRAPE) ×1 IMPLANT
DECANTER SPIKE VIAL GLASS SM (MISCELLANEOUS) ×2 IMPLANT
DRAPE PROXIMA HALF (DRAPES) ×2 IMPLANT
ELECT REM PT RETURN 9FT ADLT (ELECTROSURGICAL) ×2
ELECTRODE REM PT RTRN 9FT ADLT (ELECTROSURGICAL) ×1 IMPLANT
GLOVE BIO SURGEON STRL SZ7.5 (GLOVE) ×2 IMPLANT
GLOVE BIOGEL PI IND STRL 7.0 (GLOVE) IMPLANT
GLOVE BIOGEL PI INDICATOR 7.0 (GLOVE) ×2
GLOVE SS BIOGEL STRL SZ 6.5 (GLOVE) IMPLANT
GLOVE SUPERSENSE BIOGEL SZ 6.5 (GLOVE) ×1
GOWN STRL REIN XL XLG (GOWN DISPOSABLE) ×6 IMPLANT
HEMOSTAT SURGICEL 4X8 (HEMOSTASIS) ×2 IMPLANT
KIT ROOM TURNOVER AP CYSTO (KITS) ×2 IMPLANT
LIGASURE IMPACT 36 18CM CVD LR (INSTRUMENTS) ×1 IMPLANT
MANIFOLD NEPTUNE II (INSTRUMENTS) ×2 IMPLANT
NDL HYPO 25X1 1.5 SAFETY (NEEDLE) ×1 IMPLANT
NEEDLE HYPO 25X1 1.5 SAFETY (NEEDLE) ×2 IMPLANT
NS IRRIG 1000ML POUR BTL (IV SOLUTION) ×2 IMPLANT
PACK PERI GYN (CUSTOM PROCEDURE TRAY) ×2 IMPLANT
PAD ARMBOARD 7.5X6 YLW CONV (MISCELLANEOUS) ×2 IMPLANT
SET BASIN LINEN APH (SET/KITS/TRAYS/PACK) ×2 IMPLANT
SPONGE GAUZE 4X4 12PLY (GAUZE/BANDAGES/DRESSINGS) ×2 IMPLANT
SUT CHROMIC 3 0 SH 27 (SUTURE) ×1 IMPLANT
SUT SILK 0 FSL (SUTURE) ×2 IMPLANT
SYR CONTROL 10ML LL (SYRINGE) ×2 IMPLANT

## 2012-09-18 NOTE — Anesthesia Procedure Notes (Addendum)
Procedure Name: Intubation Date/Time: 09/18/2012 7:42 AM Performed by: Glynn Octave E Pre-anesthesia Checklist: Patient identified, Patient being monitored, Timeout performed, Emergency Drugs available and Suction available Patient Re-evaluated:Patient Re-evaluated prior to inductionOxygen Delivery Method: Circle System Utilized Preoxygenation: Pre-oxygenation with 100% oxygen Intubation Type: IV induction, Rapid sequence and Cricoid Pressure applied Ventilation: Mask ventilation without difficulty Laryngoscope Size: Mac and 3 Grade View: Grade I Tube type: Oral Tube size: 7.0 mm Number of attempts: 1 Airway Equipment and Method: stylet Placement Confirmation: ETT inserted through vocal cords under direct vision,  positive ETCO2 and breath sounds checked- equal and bilateral Secured at: 21 cm Tube secured with: Tape Dental Injury: Teeth and Oropharynx as per pre-operative assessment

## 2012-09-18 NOTE — Anesthesia Preprocedure Evaluation (Signed)
Anesthesia Evaluation  Patient identified by MRN, date of birth, ID band Patient awake    Reviewed: Allergy & Precautions, H&P , NPO status , Patient's Chart, lab work & pertinent test results  Airway Mallampati: II      Dental  (+) Edentulous Upper   Pulmonary Current Smoker,  breath sounds clear to auscultation        Cardiovascular hypertension, Pt. on medications Rhythm:Regular     Neuro/Psych    GI/Hepatic GERD-  Medicated and Controlled,  Endo/Other  diabetes, Well Controlled, Type 2, Oral Hypoglycemic Agents  Renal/GU      Musculoskeletal   Abdominal   Peds  Hematology   Anesthesia Other Findings   Reproductive/Obstetrics                           Anesthesia Physical Anesthesia Plan  ASA: III  Anesthesia Plan: General   Post-op Pain Management:    Induction: Intravenous, Rapid sequence and Cricoid pressure planned  Airway Management Planned: Oral ETT  Additional Equipment:   Intra-op Plan:   Post-operative Plan: Extubation in OR  Informed Consent: I have reviewed the patients History and Physical, chart, labs and discussed the procedure including the risks, benefits and alternatives for the proposed anesthesia with the patient or authorized representative who has indicated his/her understanding and acceptance.     Plan Discussed with:   Anesthesia Plan Comments:         Anesthesia Quick Evaluation

## 2012-09-18 NOTE — Interval H&P Note (Signed)
History and Physical Interval Note:  09/18/2012 7:22 AM  [name]  has presented today for surgery, with the diagnosis of Anal Prolapse  The various methods of treatment have been discussed with the patient and family. After consideration of risks, benefits and other options for treatment, the patient has consented to  Procedure(s) (LRB) with comments: PROCTOPLASTY (N/A) - Proctoplasty as a surgical intervention .  The patient's history has been reviewed, patient examined, no change in status, stable for surgery.  I have reviewed the patient's chart and labs.  Questions were answered to the patient's satisfaction.     Franky Macho A

## 2012-09-18 NOTE — Anesthesia Postprocedure Evaluation (Signed)
  Anesthesia Post-op Note  Patient: [name]  Procedure(s) Performed: Procedure(s) (LRB) with comments: PROCTOPLASTY (N/A)  Patient Location: PACU  Anesthesia Type: General  Level of Consciousness: awake and alert   Airway and Oxygen Therapy: Patient Spontanous Breathing and Patient connected to face mask oxygen  Post-op Pain: moderate  Post-op Assessment: Post-op Vital signs reviewed, Patient's Cardiovascular Status Stable, Respiratory Function Stable, Patent Airway and No signs of Nausea or vomiting  Post-op Vital Signs: Reviewed and stable  Complications: No apparent anesthesia complications

## 2012-09-18 NOTE — Transfer of Care (Signed)
Immediate Anesthesia Transfer of Care Note  Patient: [name]  Procedure(s) Performed: Procedure(s) (LRB) with comments: PROCTOPLASTY (N/A)  Patient Location: PACU  Anesthesia Type: General  Level of Consciousness: awake and alert   Airway & Oxygen Therapy: Patient Spontanous Breathing and Patient connected to face mask oxygen  Post-op Assessment: Report given to PACU RN  Post vital signs: Reviewed  Complications: No apparent anesthesia complications

## 2012-09-18 NOTE — Op Note (Signed)
Patient:  [name]  DOB:  January 08, 1956  MRN:  [id number]   Preop Diagnosis:  Rectal prolapse  Postop Diagnosis:  Same  Procedure:  Proctoplasty  Surgeon:  Franky Macho, M.D.  Anes:  General endotracheal  Indications:  Patient is a 56 year old black female presents with superficial mucosal rectal prolapse. The risks and benefits of the procedure including bleeding, infection, possibly recurrence of the prolapse were fully explained to the patient, gave informed consent.  Procedure note:  Patient is placed in the lithotomy position after induction of general endotracheal anesthesia. The perineum was prepped and draped using usual sterile technique with Betadine. Surgical site confirmation was performed.  The patient was noted have circumferential mucosal prolapse in the rectum. Using the LigaSure, the prolapsing mucosa was excised in segments in order to prevent stricture formation. Excess hemorrhoidal skin was excised from the anterior aspect of the anus. The resected tissue was sent to pathology for further examination. The resection line was inspected and there was no need for suture placement. The anus easily allowed more than 2 fingers into the rectum. 0.5% Sensorcaine was instilled the surrounding peritoneum. Surgicel and Viscous Xylocaine rectal packing was then placed.  All tape and needle counts were correct at the end of the procedure. Patient was extubated in the operating room and went back to recovery room awake in stable condition.  Complications:  None  EBL:  None  Specimen:  Rectal tissue

## 2012-09-23 ENCOUNTER — Emergency Department (HOSPITAL_COMMUNITY)
Admission: EM | Admit: 2012-09-23 | Discharge: 2012-09-24 | Disposition: A | Discharge status: Home / Self Care | Payer: PRIVATE HEALTH INSURANCE | Attending: Emergency Medicine | Admitting: Emergency Medicine

## 2012-09-23 ENCOUNTER — Encounter (HOSPITAL_COMMUNITY): Payer: Self-pay | Admitting: Emergency Medicine

## 2012-09-23 DIAGNOSIS — F17200 Nicotine dependence, unspecified, uncomplicated: Secondary | ICD-10-CM | POA: Insufficient documentation

## 2012-09-23 DIAGNOSIS — R339 Retention of urine, unspecified: Secondary | ICD-10-CM

## 2012-09-23 DIAGNOSIS — E119 Type 2 diabetes mellitus without complications: Secondary | ICD-10-CM | POA: Insufficient documentation

## 2012-09-23 DIAGNOSIS — K219 Gastro-esophageal reflux disease without esophagitis: Secondary | ICD-10-CM | POA: Insufficient documentation

## 2012-09-23 DIAGNOSIS — I10 Essential (primary) hypertension: Secondary | ICD-10-CM | POA: Insufficient documentation

## 2012-09-23 DIAGNOSIS — Z7982 Long term (current) use of aspirin: Secondary | ICD-10-CM | POA: Insufficient documentation

## 2012-09-23 DIAGNOSIS — E7800 Pure hypercholesterolemia, unspecified: Secondary | ICD-10-CM | POA: Insufficient documentation

## 2012-09-23 DIAGNOSIS — R338 Other retention of urine: Secondary | ICD-10-CM | POA: Insufficient documentation

## 2012-09-23 LAB — URINALYSIS, MICROSCOPIC ONLY
Bilirubin Urine: NEGATIVE
Ketones, ur: NEGATIVE mg/dL
Leukocytes, UA: NEGATIVE
Nitrite: NEGATIVE
Protein, ur: NEGATIVE mg/dL
Urobilinogen, UA: 0.2 mg/dL (ref 0.0–1.0)
pH: 6 (ref 5.0–8.0)

## 2012-09-23 NOTE — ED Notes (Signed)
Total of 1400 ml of urine from bladder.

## 2012-09-23 NOTE — ED Notes (Signed)
States that she had rectal surgery on Friday.  States that she had to strain to have a BM today and now is having lower abdominal pain and is unable to pass her urine.

## 2012-09-23 NOTE — ED Notes (Signed)
Patient reports total relief of pain with foley insertion, initial output 600 ml.

## 2012-09-23 NOTE — ED Provider Notes (Signed)
History    This chart was scribed for Suzanne Roller, MD, MD by Smitty Pluck. The patient was seen in room APA02 and the patient's care was started at 11:56PM.   CSN: [id number]  Arrival date & time 09/23/12  2317      Chief Complaint  Patient presents with  . Urinary Retention  . Abdominal Pain    (Consider location/radiation/quality/duration/timing/severity/associated sxs/prior treatment) Patient is a 56 y.o. female presenting with abdominal pain. The history is provided by the patient. No language interpreter was used.  Abdominal Pain The primary symptoms of the illness include abdominal pain. The primary symptoms of the illness do not include fever.  Additional symptoms associated with the illness include constipation.   [name] is a 56 y.o. female who presents to the Emergency Department complaining of constant, moderate abdominal pain and urinary retention onset 5 days ago with symptoms worsening. Pt had day surgery for laser rectal surgery 5 days ago but did not have a foley catheter during the procedure. She reports that she could not urinate the day of surgery. She reports after sitting in warm water and being able to urinate a very small am't of urine. But she had increased urgency of urination. Denies fevers and emesis. She had nausea and diaphoresis. She reports that she has felt constipated. She has taken Vicodin for pain relief since surgery. Nothing makes the urinary retention better, it is constant and gradually worsening, she denies fever and has no back pain  Past Medical History  Diagnosis Date  . Hypertension   . Hypercholesterolemia   . Diabetes mellitus   . GERD (gastroesophageal reflux disease)     Past Surgical History  Procedure Date  . Knee arthroscopy     left  . Cesarean section 1989    [place]  . Colonoscopy 07/24/2012    Procedure: COLONOSCOPY;  Surgeon: Dalia Heading, MD;  Location: [facility];  Service: Gastroenterology;  Laterality:  N/A;    No family history on file.  History  Substance Use Topics  . Smoking status: Current Every Day Smoker -- 1.0 packs/day for 20 years  . Smokeless tobacco: Not on file  . Alcohol Use: No    OB History    Grav Para Term Preterm Abortions TAB SAB Ect Mult Living                  Review of Systems  Constitutional: Negative for fever.  Gastrointestinal: Positive for abdominal pain and constipation.  Genitourinary: Positive for difficulty urinating.    Allergies  Review of patient's allergies indicates no known allergies.  Home Medications   Current Outpatient Rx  Name Route Sig Dispense Refill  . ASPIRIN EC 81 MG PO TBEC Oral Take 81 mg by mouth daily.    Marland Kitchen HYDROCHLOROTHIAZIDE 25 MG PO TABS Oral Take 25 mg by mouth daily.    Marland Kitchen HYDROCODONE-ACETAMINOPHEN 5-325 MG PO TABS Oral Take 1-2 tablets by mouth every 4 (four) hours as needed for pain. 50 tablet 0  . IBUPROFEN 200 MG PO TABS Oral Take 200 mg by mouth every 6 (six) hours as needed. For pain    . METFORMIN HCL 500 MG PO TABS Oral Take 500 mg by mouth 2 (two) times daily.    Marland Kitchen PRAVASTATIN SODIUM 20 MG PO TABS Oral Take 20 mg by mouth every evening.      BP 161/85  Pulse 67  Temp 98.7 F (37.1 C) (Oral)  Resp 20  Wt  207 lb (93.895 kg)  SpO2 100%  Physical Exam  Nursing note and vitals reviewed. Constitutional: She is oriented to person, place, and time. She appears well-developed and well-nourished. No distress.  HENT:  Head: Normocephalic and atraumatic.  Eyes: Conjunctivae normal are normal.  Neck: Normal range of motion. Neck supple.  Cardiovascular: Normal rate, regular rhythm and normal heart sounds.   Pulmonary/Chest: Effort normal and breath sounds normal. No respiratory distress. She has no wheezes.  Abdominal: Soft. She exhibits no distension. There is no tenderness. There is no rebound.  Genitourinary: Vagina normal.       Foley properly placed, normal appearing external genitalia  Examination  chaperoned by nurse.   Musculoskeletal: Normal range of motion. She exhibits no edema.  Neurological: She is alert and oriented to person, place, and time.       Normal gait, normal movement of the lower extremities bilaterally including strength  Skin: Skin is warm and dry.  Psychiatric: She has a normal mood and affect. Her behavior is normal.    ED Course  Procedures (including critical care time) DIAGNOSTIC STUDIES: Oxygen Saturation is 100% on room air, normal by my interpretation.    COORDINATION OF CARE: 12:02 AM Discussed ED treatment with pt     Labs Reviewed  URINALYSIS, MICROSCOPIC ONLY - Abnormal; Notable for the following:    Specific Gravity, Urine <1.005 (*)     Hgb urine dipstick TRACE (*)     All other components within normal limits   No results found.   1. Urinary retention       MDM  Urinary retention, the patient does not appear to be taking any medications that would promote this, she has been able to pass a small amount of urine at home but had approximately 1400 cc of urine in her bladder on Foley catheter placement. This has been sent to the lab and there is no signs of infection, Foley catheter will remain in place with a leg bag and a urology followup given. After Foley catheter placement and decompression of the bladder the patient states that she feels much much better and that her symptoms have all but disappeared. She has been encouraged to stop taking hydrocodone and encouraged to use a laxative. She appears stable for discharge      I personally performed the services described in this documentation, which was scribed in my presence. The recorded information has been reviewed and considered.      Suzanne Roller, MD 09/24/12 [phone]

## 2012-09-23 NOTE — ED Notes (Signed)
Patient states she feels like she needs to have a bowel movement, but she knows it is going to be very painful and is scared to go, also states that the doctor told her not to strain while trying to have a BM.  States she is taking her stool softener as prescribed.

## 2012-09-24 NOTE — ED Notes (Signed)
Convert foley bag to leg bag for use at home.  Instruction provided to patient.

## 2015-11-07 ENCOUNTER — Encounter (HOSPITAL_COMMUNITY): Payer: Self-pay | Admitting: Emergency Medicine

## 2015-11-07 ENCOUNTER — Emergency Department (HOSPITAL_COMMUNITY)
Admission: EM | Admit: 2015-11-07 | Discharge: 2015-11-07 | Disposition: A | Discharge status: Home / Self Care | Payer: PRIVATE HEALTH INSURANCE | Attending: Emergency Medicine | Admitting: Emergency Medicine

## 2015-11-07 DIAGNOSIS — F17200 Nicotine dependence, unspecified, uncomplicated: Secondary | ICD-10-CM | POA: Diagnosis not present

## 2015-11-07 DIAGNOSIS — E119 Type 2 diabetes mellitus without complications: Secondary | ICD-10-CM | POA: Insufficient documentation

## 2015-11-07 DIAGNOSIS — I10 Essential (primary) hypertension: Secondary | ICD-10-CM | POA: Diagnosis not present

## 2015-11-07 DIAGNOSIS — Z7982 Long term (current) use of aspirin: Secondary | ICD-10-CM | POA: Diagnosis not present

## 2015-11-07 DIAGNOSIS — Z7984 Long term (current) use of oral hypoglycemic drugs: Secondary | ICD-10-CM | POA: Insufficient documentation

## 2015-11-07 DIAGNOSIS — K2970 Gastritis, unspecified, without bleeding: Secondary | ICD-10-CM | POA: Diagnosis not present

## 2015-11-07 DIAGNOSIS — Z79899 Other long term (current) drug therapy: Secondary | ICD-10-CM | POA: Insufficient documentation

## 2015-11-07 DIAGNOSIS — E7800 Pure hypercholesterolemia, unspecified: Secondary | ICD-10-CM | POA: Insufficient documentation

## 2015-11-07 DIAGNOSIS — R51 Headache: Secondary | ICD-10-CM | POA: Diagnosis not present

## 2015-11-07 DIAGNOSIS — R112 Nausea with vomiting, unspecified: Secondary | ICD-10-CM | POA: Diagnosis present

## 2015-11-07 MED ORDER — ONDANSETRON 4 MG PO TBDP: ORAL_TABLET | ORAL | Status: DC

## 2015-11-07 MED ORDER — ONDANSETRON 4 MG PO TBDP
4.0000 mg | ORAL_TABLET | Freq: Once | ORAL | Status: AC
  Administered 2015-11-07: 4 mg via ORAL
  Filled 2015-11-07: qty 1

## 2015-11-07 NOTE — ED Notes (Signed)
Pt reports n/v/d that began this morning. Pt reports vomiting 3 times and had 1 episode of diarrhea. Pt also c/o of HA.

## 2015-11-07 NOTE — Discharge Instructions (Signed)
Take tylenol every 4 hours as needed and if over 6 mo of age take motrin (ibuprofen) every 6 hours as needed for fever or pain. Return for any changes, weird rashes, neck stiffness, change in behavior, new or worsening concerns.  Follow up with your physician as directed. Thank you Filed Vitals:   11/07/15 0938 11/07/15 1134  BP: 141/78   Pulse: 70   Temp: 98.1 F (36.7 C)   TempSrc: Oral   Resp: 18   Height: 5\' 9"  (1.753 m)   Weight: 221 lb (100.245 kg)   SpO2: 98% 99%

## 2015-11-07 NOTE — ED Provider Notes (Signed)
CSN: [id number]     Arrival date & time 11/07/15  0932 History  By signing my name below, I, [name], attest that this documentation has been prepared under the direction and in the presence of Blane OharaJoshua Xiana Carns, MD. Electronically Signed: Tanda RockersMargaux York, ED Scribe. 11/07/2015. 10:12 AM.  Chief Complaint  Patient presents with  . Emesis   Patient is a 59 y.o. female presenting with vomiting. The history is provided by the patient. No language interpreter was used.  Emesis Severity:  Unable to specify Timing:  Intermittent Number of daily episodes:  3 Quality:  Unable to specify Progression:  Unchanged Chronicity:  New Relieved by:  None tried Worsened by:  Nothing tried Ineffective treatments:  None tried Associated symptoms: abdominal pain, diarrhea and headaches   Risk factors: diabetes and sick contacts   Risk factors: no suspect food intake and no travel to endemic areas      HPI Comments: [name] is a 59 y.o. female who presents to the Emergency Department complaining of nausea, vomiting, diarrhea that began this morning. Pt reports that her son began vomiting and having diarrhea at 2 AM this morning prior to her having similar symptoms. She was in the ED this morning with her son and decided to check in to be evaluated. Pt reports 3 episodes of vomiting and 1 episode of diarrhea since onset. She also complains of abdominal pain and a mild right frontal headache but notes hx of sinus issues. Denies any other associated symptoms. No recent foreign travel or suspicious foods. Pt has hx of DM but states it is well controlled.   Past Medical History  Diagnosis Date  . Hypertension   . Hypercholesterolemia   . Diabetes mellitus   . GERD (gastroesophageal reflux disease)    Past Surgical History  Procedure Laterality Date  . Knee arthroscopy      left  . Cesarean section  1989      . Colonoscopy  07/24/2012    Procedure: COLONOSCOPY;  Surgeon: Dalia HeadingMark A Jenkins,  MD;  Location: [facility];  Service: Gastroenterology;  Laterality: N/A;   No family history on file. Social History  Substance Use Topics  . Smoking status: Current Every Day Smoker -- 1.00 packs/day for 20 years  . Smokeless tobacco: None  . Alcohol Use: No   OB History    No data available     Review of Systems  Gastrointestinal: Positive for nausea, vomiting, abdominal pain and diarrhea. Negative for constipation and blood in stool.  Neurological: Positive for headaches.  All other systems reviewed and are negative.  Allergies  Review of patient's allergies indicates no known allergies.  Home Medications   Prior to Admission medications   Medication Sig Start Date End Date Taking? Authorizing Provider  aspirin EC 81 MG tablet Take 81 mg by mouth daily.    Historical Provider, MD  hydrochlorothiazide (HYDRODIURIL) 25 MG tablet Take 25 mg by mouth daily.    Historical Provider, MD  HYDROcodone-acetaminophen (NORCO) 5-325 MG per tablet Take 1-2 tablets by mouth every 4 (four) hours as needed for pain. 09/18/12   Franky MachoMark Jenkins, MD  ibuprofen (ADVIL,MOTRIN) 200 MG tablet Take 200 mg by mouth every 6 (six) hours as needed. For pain    Historical Provider, MD  metFORMIN (GLUCOPHAGE) 500 MG tablet Take 500 mg by mouth 2 (two) times daily.    Historical Provider, MD  pravastatin (PRAVACHOL) 20 MG tablet Take 20 mg by mouth every evening.  Historical Provider, MD   Triage Vitals: BP 141/78 mmHg  Pulse 70  Temp(Src) 98.1 F (36.7 C) (Oral)  Resp 18  Ht  (1.753 m)  Wt 221 lb (100.245 kg)  BMI 32.62 kg/m2  SpO2 98%   Physical Exam  Constitutional: She is oriented to person, place, and time. She appears well-developed and well-nourished. No distress.  HENT:  Head: Normocephalic and atraumatic.  Mouth/Throat: Mucous membranes are dry.  Eyes: Conjunctivae and EOM are normal.  Neck: Neck supple. No tracheal deviation present.  Cardiovascular: Normal rate.    Pulmonary/Chest: Effort normal. No respiratory distress.  Abdominal: Soft. There is tenderness in the epigastric area.  Mild epigastric discomfort No peritonitis No other areas are tender   Musculoskeletal: Normal range of motion.  Neurological: She is alert and oriented to person, place, and time.  Skin: Skin is warm and dry.  Psychiatric: She has a normal mood and affect. Her behavior is normal.  Nursing note and vitals reviewed.   ED Course  Procedures (including critical care time)  DIAGNOSTIC STUDIES: Oxygen Saturation is 98% on RA, normal by my interpretation.    COORDINATION OF CARE: 10:08 AM-Discussed treatment plan which includes nausea medication with pt at bedside and pt agreed to plan.   Labs Review Labs Reviewed - No data to display  Imaging Review No results found.    EKG Interpretation None      MDM   Final diagnoses:  Gastritis   I personally performed the services described in this documentation, which was scribed in my presence. The recorded information has been reviewed and is accurate. Patient presents with clinical concern for gastritis, family members with similar. Patient okay to try Zofran oral fluid challenge. Patient well-appearing otherwise no localized abdominal pain to suggest appendicitis gallbladder etc. Patient observed in the ER on reassessment patient improved she tolerated oral fluids with the nurse. Patient okay holding on blood work at this time and IV fluids. Reasons to return discussed. Patient has diabetic medicines at home.  Results and differential diagnosis were discussed with the patient/parent/guardian. Xrays were independently reviewed by myself.  Close follow up outpatient was discussed, comfortable with the plan.   Medications  ondansetron (ZOFRAN-ODT) disintegrating tablet 4 mg (4 mg Oral Given 11/07/15 1011)    Filed Vitals:   11/07/15 0938  BP: 141/78  Pulse: 70  Temp: 98.1 F (36.7 C)  TempSrc: Oral  Resp:  18  Height:  (1.753 m)  Weight: 221 lb (100.245 kg)  SpO2: 98%    Final diagnoses:  Gastritis      Blane Ohara, MD 11/07/15 1146

## 2017-11-24 ENCOUNTER — Other Ambulatory Visit: Payer: Self-pay | Admitting: Internal Medicine

## 2017-11-24 ENCOUNTER — Other Ambulatory Visit (HOSPITAL_COMMUNITY): Payer: Self-pay | Admitting: Internal Medicine

## 2017-11-24 DIAGNOSIS — R221 Localized swelling, mass and lump, neck: Secondary | ICD-10-CM

## 2017-11-28 ENCOUNTER — Ambulatory Visit (HOSPITAL_COMMUNITY): Payer: PRIVATE HEALTH INSURANCE

## 2017-12-03 ENCOUNTER — Other Ambulatory Visit (HOSPITAL_COMMUNITY): Payer: Self-pay | Admitting: Internal Medicine

## 2017-12-03 ENCOUNTER — Ambulatory Visit (HOSPITAL_COMMUNITY)
Admission: RE | Admit: 2017-12-03 | Discharge: 2017-12-03 | Disposition: A | Discharge status: Home / Self Care | Payer: PRIVATE HEALTH INSURANCE | Source: Ambulatory Visit | Attending: Internal Medicine | Admitting: Internal Medicine

## 2017-12-03 DIAGNOSIS — R221 Localized swelling, mass and lump, neck: Secondary | ICD-10-CM

## 2017-12-03 DIAGNOSIS — E042 Nontoxic multinodular goiter: Secondary | ICD-10-CM | POA: Insufficient documentation

## 2018-01-29 ENCOUNTER — Ambulatory Visit (INDEPENDENT_AMBULATORY_CARE_PROVIDER_SITE_OTHER): Payer: Self-pay | Admitting: "Endocrinology

## 2018-01-29 ENCOUNTER — Encounter: Payer: Self-pay | Admitting: "Endocrinology

## 2018-01-29 VITALS — BP 146/83 | HR 68 | Ht 68.0 in | Wt 233.0 lb

## 2018-01-29 DIAGNOSIS — E1165 Type 2 diabetes mellitus with hyperglycemia: Secondary | ICD-10-CM | POA: Insufficient documentation

## 2018-01-29 DIAGNOSIS — E782 Mixed hyperlipidemia: Secondary | ICD-10-CM | POA: Insufficient documentation

## 2018-01-29 DIAGNOSIS — F17200 Nicotine dependence, unspecified, uncomplicated: Secondary | ICD-10-CM | POA: Insufficient documentation

## 2018-01-29 DIAGNOSIS — E042 Nontoxic multinodular goiter: Secondary | ICD-10-CM | POA: Insufficient documentation

## 2018-01-29 DIAGNOSIS — I10 Essential (primary) hypertension: Secondary | ICD-10-CM | POA: Insufficient documentation

## 2018-01-29 NOTE — Progress Notes (Signed)
Consult Note                                            01/29/2018, 12:50 PM   Subjective:    Patient ID: [name], female    DOB: 1956/05/23, PCP Reynolds Bowl, MD   Past Medical History:  Diagnosis Date  . Diabetes mellitus   . GERD (gastroesophageal reflux disease)   . Hypercholesterolemia   . Hypertension    Past Surgical History:  Procedure Laterality Date  . CESAREAN SECTION  1989     . COLONOSCOPY  07/24/2012   Procedure: COLONOSCOPY;  Surgeon: Dalia Heading, MD;  Location: [facility];  Service: Gastroenterology;  Laterality: N/A;  . KNEE ARTHROSCOPY     left   Social History   Socioeconomic History  . Marital status: Divorced    Spouse name: None  . Number of children: None  . Years of education: None  . Highest education level: None  Social Needs  . Financial resource strain: None  . Food insecurity - worry: None  . Food insecurity - inability: None  . Transportation needs - medical: None  . Transportation needs - non-medical: None  Occupational History  . None  Tobacco Use  . Smoking status: Current Every Day Smoker    Packs/day: 1.00    Years: 20.00    Pack years: 20.00  . Smokeless tobacco: Never Used  Substance and Sexual Activity  . Alcohol use: No  . Drug use: No  . Sexual activity: Yes    Birth control/protection: Post-menopausal  Other Topics Concern  . None  Social History Narrative  . None   Outpatient Encounter Medications as of 01/29/2018  Medication Sig  . aspirin EC 81 MG tablet Take 81 mg by mouth daily.  . hydrochlorothiazide (HYDRODIURIL) 25 MG tablet Take 25 mg by mouth daily.  . metFORMIN (GLUCOPHAGE) 1000 MG tablet Take 1,000 mg by mouth 2 (two) times daily.   . sitaGLIPtin (JANUVIA) 100 MG tablet Take 100 mg by mouth daily.   . [DISCONTINUED] HYDROcodone-acetaminophen (NORCO) 5-325 MG per tablet Take 1-2 tablets by mouth every 4 (four) hours as needed for pain. (Patient not taking: Reported on  11/07/2015)  . [DISCONTINUED] ibuprofen (ADVIL,MOTRIN) 200 MG tablet Take 200 mg by mouth every 6 (six) hours as needed. For pain  . [DISCONTINUED] ondansetron (ZOFRAN ODT) 4 MG disintegrating tablet 4mg  ODT q4 hours prn nausea/vomit   No facility-administered encounter medications on file as of 01/29/2018.    ALLERGIES: No Known Allergies  VACCINATION STATUS:  There is no immunization history on file for this patient.  HPI ADASIA York is 62 y.o. female who presents today with a medical history as above. she is being seen in consultation for multinodular goiter requested by Reynolds Bowl, MD.  She denies any prior history of thyroid dysfunction.  On December 03, 2017 she underwent a thyroid ultrasound after clinical goiter was diagnosed.  This study revealed multiple nodules in her thyroid hence consult for gynecology was requested. -Patient denies history of dysphagia, odynophagia, change in her voice.  She is a chronic heavy smoker.  She denies any family history of thyroid dysfunction nor thyroid cancer.  Patient denies any radiation exposure to her neck. -She does not have recent thyroid function tests to review.  She denies heat/cold intolerance, palpitations, tremors. -  Review of her history reveals she has type 2 diabetes for at least 10 years with no recent A1c.  She is on metformin 1000 mg p.o. twice daily and Januvia 100 mg p.o. daily.  She also has hypertension currently on hydrochlorothiazide 25 mg p.o. daily.  Her blood pressure is not controlled measuring 146/83. -She is now complaining of weight gain, however her medical record shows that she is progressively gaining significant weight. -She is not on any antithyroid medications nor thyroid hormone supplements.  Review of Systems  Constitutional: + weight gain, no fatigue, no subjective hyperthermia, no subjective hypothermia Eyes: no blurry vision, no xerophthalmia ENT: no sore throat, + nodules palpated in throat, no  dysphagia/odynophagia, no hoarseness Cardiovascular: no Chest Pain, no Shortness of Breath, no palpitations, no leg swelling Respiratory: no cough, no SOB Gastrointestinal: no Nausea/Vomiting/Diarhhea Musculoskeletal: no muscle/joint aches Skin: no rashes Neurological: no tremors, no numbness, no tingling, no dizziness Psychiatric: no depression, no anxiety  Objective:    BP (!) 146/83   Pulse 68   Ht 5\' 8"  (1.727 m)   Wt 233 lb (105.7 kg)   BMI 35.43 kg/m   Wt Readings from Last 3 Encounters:  01/29/18 233 lb (105.7 kg)  11/07/15 221 lb (100.2 kg)  09/23/12 207 lb (93.9 kg)    Physical Exam  Constitutional: + Obese, not in acute distress, normal state of mind Eyes: PERRLA, EOMI, no exophthalmos ENT: moist mucous membranes, + nodular thyromegaly, no cervical lymphadenopathy Cardiovascular: normal precordial activity, + distant heart sounds, no Murmur/Rubs/Gallops Respiratory:  adequate breathing efforts, no gross chest deformity, + scattered wheezes on bilateral lung fields,. Gastrointestinal: abdomen soft, Non -tender, No distension, Bowel Sounds present Musculoskeletal: no gross deformities, strength intact in all four extremities Skin: moist, warm, no rashes Neurological: no tremor with outstretched hands, Deep tendon reflexes normal in all four extremities.  CMP ( most recent) CMP     Component Value Date/Time   NA 139 09/11/2012 1430   K 3.6 09/11/2012 1430   CL 99 09/11/2012 1430   CO2 30 09/11/2012 1430   GLUCOSE 123 (H) 09/11/2012 1430   BUN 8 09/11/2012 1430   CREATININE 0.75 09/11/2012 1430   CALCIUM 9.6 09/11/2012 1430   PROT 6.6 03/04/2009 0130   ALBUMIN 3.2 (L) 03/04/2009 0130   AST 22 03/04/2009 0130   ALT 11 03/04/2009 0130   ALKPHOS 53 03/04/2009 0130   BILITOT 0.6 03/04/2009 0130   GFRNONAA >90 09/11/2012 1430   GFRAA >90 09/11/2012 1430     Diabetic Labs (most recent): Lab Results  Component Value Date   HGBA1C 7.3 03/06/2009   HGBA1C (H)  03/04/2009    7.3 (NOTE)   The ADA recommends the following therapeutic goal for glycemic   control related to Hgb A1C measurement:   Goal of Therapy:   < 7.0% Hgb A1C   Reference: American Diabetes Association: Clinical Practice   Recommendations 2008, Diabetes Care,  2008, 31:(Suppl 1).     Lipid Panel ( most recent) Lipid Panel     Component Value Date/Time   CHOL  03/04/2009 0130    154        ATP III CLASSIFICATION:  <200     mg/dL   Desirable  161-096  mg/dL   Borderline High  >=045    mg/dL   High          TRIG 409 03/04/2009 0130   HDL 26 (L) 03/04/2009 0130   CHOLHDL 5.9 03/04/2009 0130  VLDL 22 03/04/2009 0130   LDLCALC (H) 03/04/2009 0130    106        Total Cholesterol/HDL:CHD Risk Coronary Heart Disease Risk Table                     Men   Women  1/2 Average Risk   3.4   3.3  Average Risk       5.0   4.4  2 X Average Risk   9.6   7.1  3 X Average Risk  23.4   11.0        Use the calculated Patient Ratio above and the CHD Risk Table to determine the patient's CHD Risk.        ATP III CLASSIFICATION (LDL):  <100     mg/dL   Optimal  [id number]  mg/dL   Near or Above                    Optimal  130-159  mg/dL   Borderline  [id number]  mg/dL   High  >956>190     mg/dL   Very High      Report of her ultrasound from December 03, 2017: Right lobe 5.3 cm with 3 nodules measuring 2.6 cm, 2.1 cm, and 0.8 cm.  Left lobe measuring 4.3 cm with 1.5 cm suspicious nodule.       Assessment & Plan:   1. Multinodular goiter - Suzanne HolsterBeverly A Player  is being seen at a kind request of Reynolds Bowlomstock, Lloyd, MD. - I have reviewed her available thyroid records and clinically evaluated the patient. - Based on my reviews, she has euthyroid multinodular goiter. -She is approached for fine-needle aspiration of largest nodule on the right lobe and 1.5 cm suspicious nodule on the left lobe.   -She will need a complete thyroid function test today, will be sent to lab.  - I did not initiate any new  prescriptions today.  2. Uncontrolled type 2 diabetes mellitus with hyperglycemia (HCC) -No recent labs to review.  I have ordered CMP and A1c with lipid panel along with her thyroid function test today.  She is currently on metformin 1000 mg p.o. twice daily and Januvia 100 mg p.o. daily.  3. Mixed hyperlipidemia -She has history of hyperlipidemia currently not on treatment.  I will obtain fasting lipid panel along with her other labs today.  4. Essential hypertension, benign -Uncontrolled measuring 146/83.  She is encouraged to continue hydrochlorothiazide 25 mg p.o. daily.  She will be considered for additional medications if necessary on subsequent visits.  5. Current smoker -She is a chronic heavy smoker with some scattered wheezes on lung fields, at extremely high risk for COPD.  She is extensively counseled against smoking.   - Time spent with the patient: 45 minutes, of which >50% was spent in obtaining information about her symptoms, reviewing her previous labs, evaluations, and treatments, counseling her about her multinodular goiter, chronic heavy smoking, and developing a plan for long term treatment.  - I advised patient to maintain close follow up with Reynolds Bowlomstock, Lloyd, MD for primary care needs. Follow up plan: Return in about 2 weeks (around 02/12/2018) for labs today, follow up with biopsy results.   Marquis LunchGebre Lyrik Dockstader, MD [facility] [address] [place], [place] [address] Phone: [phone]  Fax: [phone]     01/29/2018, 12:50 PM  This note was partially dictated with voice recognition software. Similar sounding words can  be transcribed inadequately or may not  be corrected upon review.

## 2018-01-30 LAB — COMPLETE METABOLIC PANEL WITH GFR
AG Ratio: 1.3 (calc) (ref 1.0–2.5)
ALKALINE PHOSPHATASE (APISO): 62 U/L (ref 33–130)
ALT: 9 U/L (ref 6–29)
AST: 13 U/L (ref 10–35)
Albumin: 4 g/dL (ref 3.6–5.1)
BILIRUBIN TOTAL: 0.3 mg/dL (ref 0.2–1.2)
BUN: 8 mg/dL (ref 7–25)
CHLORIDE: 101 mmol/L (ref 98–110)
CO2: 30 mmol/L (ref 20–32)
Calcium: 9.3 mg/dL (ref 8.6–10.4)
Creat: 0.81 mg/dL (ref 0.50–1.05)
GFR, EST AFRICAN AMERICAN: 92 mL/min/{1.73_m2} (ref >=60)
GFR, Est Non African American: 79 mL/min/{1.73_m2} (ref >=60)
GLUCOSE: 163 mg/dL — AB (ref 65–99)
Globulin: 3 g/dL (calc) (ref 1.9–3.7)
Potassium: 3.9 mmol/L (ref 3.5–5.3)
Sodium: 140 mmol/L (ref 135–146)
TOTAL PROTEIN: 7 g/dL (ref 6.1–8.1)

## 2018-01-30 LAB — LIPID PANEL
CHOLESTEROL: 224 mg/dL — AB (ref <200)
HDL: 42 mg/dL — ABNORMAL LOW (ref >50)
LDL Cholesterol (Calc): 155 mg/dL (calc) — ABNORMAL HIGH
Non-HDL Cholesterol (Calc): 182 mg/dL (calc) — ABNORMAL HIGH (ref <130)
Total CHOL/HDL Ratio: 5.3 (calc) — ABNORMAL HIGH (ref <5.0)
Triglycerides: 141 mg/dL (ref <150)

## 2018-01-30 LAB — HEMOGLOBIN A1C
EAG (MMOL/L): 9.2 (calc)
HEMOGLOBIN A1C: 7.4 %{Hb} — AB (ref <5.7)
MEAN PLASMA GLUCOSE: 166 (calc)

## 2018-01-30 LAB — THYROGLOBULIN ANTIBODY

## 2018-01-30 LAB — TSH: TSH: 0.6 m[IU]/L (ref 0.40–4.50)

## 2018-01-30 LAB — THYROID PEROXIDASE ANTIBODY: Thyroperoxidase Ab SerPl-aCnc: 1 IU/mL (ref <9)

## 2018-01-30 LAB — T4, FREE: Free T4: 1 ng/dL (ref 0.8–1.8)

## 2018-02-04 ENCOUNTER — Ambulatory Visit (HOSPITAL_COMMUNITY): Payer: Self-pay

## 2018-02-18 ENCOUNTER — Ambulatory Visit: Payer: Medicaid Other | Admitting: "Endocrinology

## 2018-02-18 ENCOUNTER — Encounter: Payer: Self-pay | Admitting: "Endocrinology

## 2018-07-28 ENCOUNTER — Ambulatory Visit: Payer: Medicaid Other | Admitting: "Endocrinology

## 2018-08-27 ENCOUNTER — Encounter: Payer: Self-pay | Admitting: "Endocrinology

## 2018-08-27 ENCOUNTER — Ambulatory Visit: Payer: Medicaid Other | Admitting: "Endocrinology

## 2020-02-21 ENCOUNTER — Other Ambulatory Visit: Payer: Self-pay

## 2020-02-21 ENCOUNTER — Emergency Department (HOSPITAL_COMMUNITY): Payer: PRIVATE HEALTH INSURANCE

## 2020-02-21 ENCOUNTER — Emergency Department (HOSPITAL_COMMUNITY)
Admission: EM | Admit: 2020-02-21 | Discharge: 2020-02-21 | Disposition: A | Discharge status: Home / Self Care | Payer: PRIVATE HEALTH INSURANCE | Attending: Emergency Medicine | Admitting: Emergency Medicine

## 2020-02-21 ENCOUNTER — Encounter (HOSPITAL_COMMUNITY): Payer: Self-pay

## 2020-02-21 DIAGNOSIS — Z79899 Other long term (current) drug therapy: Secondary | ICD-10-CM | POA: Insufficient documentation

## 2020-02-21 DIAGNOSIS — I10 Essential (primary) hypertension: Secondary | ICD-10-CM | POA: Insufficient documentation

## 2020-02-21 DIAGNOSIS — Z7982 Long term (current) use of aspirin: Secondary | ICD-10-CM | POA: Insufficient documentation

## 2020-02-21 DIAGNOSIS — F17210 Nicotine dependence, cigarettes, uncomplicated: Secondary | ICD-10-CM | POA: Insufficient documentation

## 2020-02-21 DIAGNOSIS — E119 Type 2 diabetes mellitus without complications: Secondary | ICD-10-CM | POA: Diagnosis not present

## 2020-02-21 DIAGNOSIS — Z7984 Long term (current) use of oral hypoglycemic drugs: Secondary | ICD-10-CM | POA: Diagnosis not present

## 2020-02-21 DIAGNOSIS — R030 Elevated blood-pressure reading, without diagnosis of hypertension: Secondary | ICD-10-CM

## 2020-02-21 LAB — URINALYSIS, ROUTINE W REFLEX MICROSCOPIC
Bilirubin Urine: NEGATIVE
Glucose, UA: NEGATIVE mg/dL
Hgb urine dipstick: NEGATIVE
Ketones, ur: NEGATIVE mg/dL
Leukocytes,Ua: NEGATIVE
Nitrite: NEGATIVE
Protein, ur: NEGATIVE mg/dL
Specific Gravity, Urine: 1.011 (ref 1.005–1.030)
pH: 6 (ref 5.0–8.0)

## 2020-02-21 LAB — BASIC METABOLIC PANEL
Anion gap: 12 (ref 5–15)
BUN: 11 mg/dL (ref 8–23)
CO2: 28 mmol/L (ref 22–32)
Calcium: 9.3 mg/dL (ref 8.9–10.3)
Chloride: 98 mmol/L (ref 98–111)
Creatinine, Ser: 0.82 mg/dL (ref 0.44–1.00)
GFR calc Af Amer: >60 mL/min (ref >60)
GFR calc non Af Amer: >60 mL/min (ref >60)
Glucose, Bld: 195 mg/dL — ABNORMAL HIGH (ref 70–99)
Potassium: 3.3 mmol/L — ABNORMAL LOW (ref 3.5–5.1)
Sodium: 138 mmol/L (ref 135–145)

## 2020-02-21 LAB — CBC
HCT: 44.6 % (ref 36.0–46.0)
Hemoglobin: 13.8 g/dL (ref 12.0–15.0)
MCH: 24 pg — ABNORMAL LOW (ref 26.0–34.0)
MCHC: 30.9 g/dL (ref 30.0–36.0)
MCV: 77.4 fL — ABNORMAL LOW (ref 80.0–100.0)
Platelets: 318 10*3/uL (ref 150–400)
RBC: 5.76 MIL/uL — ABNORMAL HIGH (ref 3.87–5.11)
RDW: 16.9 % — ABNORMAL HIGH (ref 11.5–15.5)
WBC: 8.7 10*3/uL (ref 4.0–10.5)
nRBC: 0 % (ref 0.0–0.2)

## 2020-02-21 LAB — TROPONIN I (HIGH SENSITIVITY): Troponin I (High Sensitivity): 4 ng/L (ref <18)

## 2020-02-21 MED ORDER — POTASSIUM CHLORIDE CRYS ER 20 MEQ PO TBCR
20.0000 meq | EXTENDED_RELEASE_TABLET | Freq: Once | ORAL | Status: AC
  Administered 2020-02-21: 21:00:00 20 meq via ORAL
  Filled 2020-02-21: qty 1

## 2020-02-21 NOTE — ED Provider Notes (Signed)
Medical screening examination/treatment/procedure(s) were conducted as a shared visit with non-physician practitioner(s) and myself.  I personally evaluated the patient during the encounter.  EKG Interpretation  Date/Time:  Monday February 21 2020 16:54:21 EDT Ventricular Rate:  76 PR Interval:  156 QRS Duration: 94 QT Interval:  416 QTC Calculation: 468 R Axis:   60 Text Interpretation: Sinus rhythm with Premature atrial complexes Biatrial enlargement Incomplete right bundle branch block Septal infarct , age undetermined Abnormal ECG since last tracing no significant change Confirmed by Eber Hong ([id number]) on 02/21/2020 5:00:15 PM   Patient seen by me along with physician assistant.  Patient sent in from [facility] practice for an EKG that showed some inferior ST segment depression.  No significant change from previous but in lead to a little more deepened.  Patient's troponin negative.  In addition patient was known to be hypertensive there today.  Patient is on hydrochlorothiazide.  Labs without any significant abnormalities.  Blood pressure here initially was 201/80.  And then over time without any intervention at 156/93 still elevated.  Patient was gone to follow-up with her primary care doctor.  Had discussion for consideration for may be developing a blood pressure log daily.  Patient may need some adjustment on her hypertensive meds or a new med added.  Patient's potassium here was 3.3.  Was given some oral potassium.  Patient stable for discharge home.  We will give her referral to cardiology since they were concerned about the EKG.  And will have her go back to primary care doctor for blood pressure follow-up.  Patient nontoxic no acute distress.   Vanetta Mulders, MD 02/21/20 2108

## 2020-02-21 NOTE — ED Provider Notes (Signed)
[facility] EMERGENCY DEPARTMENT Provider Note   CSN: [id number] Arrival date & time: 02/21/20  1646     History Chief Complaint  Patient presents with  . Hypertension    [name] is a 64 y.o. female with a past medical history significant for diabetes mellitus, GERD, hyperlipidemia, and hypertension who presents to the ED from [facility] due to elevated blood pressure.  Per MD patient's blood pressure was 158/83 at the office just prior to arrival.  Patient denies current chest pain, dizziness, headache, changes to vision, nausea, and vomiting.  Patient admits to intermittent episodes of central chest pain that occur both at rest and with exertion for the past couple of months.  She notes her last episode was on Thursday.  Chest pain is quickly relieved by drinking water and taking a baby aspirin.  Denies previous episodes of chest pain prior to the past few months. She also admits to bilateral pulsing in her ears that has been present for the past 2 months and has worsened. She notes she can "hear her heartbeat in her ears". She also admit to having a headache on Thursday that quickly resolved after rest and ibuprofen. No further headaches since then. Denies fever and chills. Denies recent illness. Patient is currently on HCTZ 25mg  which she has been complaint with.   History obtained from patient and past medical records. No interpreter used during encounter.      Past Medical History:  Diagnosis Date  . Diabetes mellitus   . GERD (gastroesophageal reflux disease)   . Hypercholesterolemia   . Hypertension     Patient Active Problem List   Diagnosis Date Noted  . Multinodular goiter 01/29/2018  . Uncontrolled type 2 diabetes mellitus with hyperglycemia (HCC) 01/29/2018  . Mixed hyperlipidemia 01/29/2018  . Essential hypertension, benign 01/29/2018  . Current smoker 01/29/2018    Past Surgical History:  Procedure Laterality Date  . CESAREAN SECTION  1989   [place]  . COLONOSCOPY  07/24/2012   Procedure: COLONOSCOPY;  Surgeon: 07/26/2012, MD;  Location: [facility];  Service: Gastroenterology;  Laterality: N/A;  . KNEE ARTHROSCOPY     left     OB History   No obstetric history on file.     Family History  Problem Relation Age of Onset  . Diabetes Mother   . Hypertension Mother   . Diabetes Sister   . Hypertension Sister   . Heart attack Sister     Social History   Tobacco Use  . Smoking status: Current Every Day Smoker    Packs/day: 1.00    Years: 20.00    Pack years: 20.00  . Smokeless tobacco: Never Used  Substance Use Topics  . Alcohol use: No  . Drug use: No    Home Medications Prior to Admission medications   Medication Sig Start Date End Date Taking? Authorizing Provider  aspirin EC 81 MG tablet Take 81 mg by mouth daily.    [provider]  hydrochlorothiazide (HYDRODIURIL) 25 MG tablet Take 25 mg by mouth daily.    [provider]  metFORMIN (GLUCOPHAGE) 1000 MG tablet Take 1,000 mg by mouth 2 (two) times daily.     [provider]  pravastatin (PRAVACHOL) 10 MG tablet Take 10 mg by mouth daily. 11/22/19   [provider]  sitaGLIPtin (JANUVIA) 100 MG tablet Take 100 mg by mouth daily.     [provider]    Allergies    Patient  has no known allergies.  Review of Systems   Review of Systems  Constitutional: Negative for chills and fever.  HENT: Positive for tinnitus.   Respiratory: Negative for shortness of breath.   Cardiovascular: Positive for chest pain (none current; last episode Thursday).  Gastrointestinal: Negative for abdominal pain, diarrhea, nausea and vomiting.  Genitourinary: Negative for dysuria.  Neurological: Negative for dizziness, numbness and headaches.  All other systems reviewed and are negative.   Physical Exam Updated Vital Signs BP (!) 173/93   Pulse 68   Temp 98.3 F (36.8 C) (Oral)   Resp 20   Ht 5\' 8"  (1.727 m)   Wt  102.1 kg   SpO2 98%   BMI 34.21 kg/m   Physical Exam Vitals reviewed.  Constitutional:      General: She is not in acute distress.    Appearance: She is not ill-appearing.  HENT:     Head: Normocephalic.  Eyes:     Pupils: Pupils are equal, round, and reactive to light.  Cardiovascular:     Rate and Rhythm: Normal rate and regular rhythm.     Pulses: Normal pulses.     Heart sounds: Normal heart sounds. No murmur. No friction rub. No gallop.   Pulmonary:     Effort: Pulmonary effort is normal.     Breath sounds: Normal breath sounds.  Abdominal:     General: Abdomen is flat. There is no distension.     Palpations: Abdomen is soft.     Tenderness: There is no abdominal tenderness. There is no guarding or rebound.  Musculoskeletal:     Cervical back: Neck supple.     Comments: Able to move all 4 extremities without difficulty. No lower extremity edema.   Skin:    General: Skin is warm and dry.  Neurological:     General: No focal deficit present.     Mental Status: She is alert.     Cranial Nerves: No cranial nerve deficit.  Psychiatric:        Mood and Affect: Mood normal.        Behavior: Behavior normal.     ED Results / Procedures / Treatments   Labs (all labs ordered are listed, but only abnormal results are displayed) Labs Reviewed  BASIC METABOLIC PANEL - Abnormal; Notable for the following components:      Result Value   Potassium 3.3 (*)    Glucose, Bld 195 (*)    All other components within normal limits  CBC - Abnormal; Notable for the following components:   RBC 5.76 (*)    MCV 77.4 (*)    MCH 24.0 (*)    RDW 16.9 (*)    All other components within normal limits  URINALYSIS, ROUTINE W REFLEX MICROSCOPIC  TROPONIN I (HIGH SENSITIVITY)    EKG EKG Interpretation  Date/Time:  Monday February 21 2020 16:54:21 EDT Ventricular Rate:  76 PR Interval:  156 QRS Duration: 94 QT Interval:  416 QTC Calculation: 468 R Axis:   60 Text Interpretation: Sinus  rhythm with Premature atrial complexes Biatrial enlargement Incomplete right bundle branch block Septal infarct , age undetermined Abnormal ECG since last tracing no significant change Confirmed by Noemi Chapel [phone]) on 02/21/2020 5:00:15 PM   Radiology DG Chest 2 View  Result Date: 02/21/2020 CLINICAL DATA:  Chest pain. Additional history provided: Hypertension EXAM: CHEST - 2 VIEW COMPARISON:  Chest radiograph 03/18/2004 FINDINGS: Heart size within normal limits. There is no evidence of airspace consolidation  within the lungs. No evidence of pleural effusion or pneumothorax. No acute bony abnormality.  Thoracic spondylosis. IMPRESSION: No evidence of acute cardiopulmonary abnormality. Electronically Signed   By: Jackey Loge DO   On: 02/21/2020 18:09    Procedures Procedures (including critical care time)  Medications Ordered in ED Medications  potassium chloride SA (KLOR-CON) CR tablet 20 mEq (20 mEq Oral Given 02/21/20 2056)    ED Course  I have reviewed the triage vital signs and the nursing notes.  Pertinent labs & imaging results that were available during my care of the patient were reviewed by me and considered in my medical decision making (see chart for details).  Clinical Course as of Feb 21 2131  Mon Feb 21, 2020  2041 Troponin I (High Sensitivity): 4 [CA]    Clinical Course User Index [CA] Jesusita Oka   MDM Rules/Calculators/A&P                     64 year old female presents to the ED from PCP due to elevated blood pressure.  Patient denies current chest pain, dizziness, headache, visual changes, and urinary symptoms. She admits to intermittent episodes of central chest pain for the past few months that have worsened and occur both with rest and exertion. Last episode being Thursday. Currently takes HCTZ 25mg  which she has been complaint with. Stable vitals with elevated BP. Initial BP 201/80. Will continue to monitor. Patient in no acute distress and  non-ill appearing. Will obtain routine labs, troponin, and UA to rule out end organ damage.   CBC reassuring with no leukocytosis.  Chest x-ray personally reviewed which is negative for signs of pneumonia, pneumothorax, or widened mediastinum.  EKG personally reviBMP reassuring with normal renal function, mild hypokalemia at 3.3, and hyperglycemia at 195 with no anion gap.  Doubt DKA.  Potassium repleted here in the ED.  Initial troponin normal at 4.  Given patient's last episode of chest pain was Thursday no need for delta troponin at this time. Doubt ACS.  UA negative for signs of infection or proteinuria. No concern for end organ damage at this time. Blood pressure continued to improve throughout her ED stay. No concern for HTN urgency/emergency. Will discharge patient with PCP follow-up. Instructed patient to call tomorrow to schedule an appointment for further evaluation. Discussed case with Dr. Tuesday who evaluated patient at bedside and agrees with assessment and plan. Strict ED precautions discussed with patient. Patient states understanding and agrees to plan. Patient discharged home in no acute distress and stable vitals  Final Clinical Impression(s) / ED Diagnoses Final diagnoses:  Elevated blood pressure reading    Rx / DC Orders ED Discharge Orders    None       Deretha Emory 02/21/20 2134    2135, MD 02/22/20 [phone]

## 2020-02-21 NOTE — Discharge Instructions (Signed)
As discussed, all of your labs looked good today. Schedule an appointment with your PCP for further evaluation of your elevated blood pressure. Continue taking your medications as prescribed. Return to the ER for new or worsening symptoms.

## 2020-02-21 NOTE — ED Triage Notes (Signed)
Pt sent to ED by [facility] for hypertension. Per MD, BP 158/83. Pt denies chest pain, dizziness, or headache.

## 2020-11-08 ENCOUNTER — Emergency Department (HOSPITAL_COMMUNITY): Payer: No Typology Code available for payment source

## 2020-11-08 ENCOUNTER — Encounter (HOSPITAL_COMMUNITY): Payer: Self-pay

## 2020-11-08 ENCOUNTER — Emergency Department (HOSPITAL_COMMUNITY)
Admission: EM | Admit: 2020-11-08 | Discharge: 2020-11-08 | Disposition: A | Discharge status: Home / Self Care | Payer: No Typology Code available for payment source | Attending: Emergency Medicine | Admitting: Emergency Medicine

## 2020-11-08 ENCOUNTER — Other Ambulatory Visit: Payer: Self-pay

## 2020-11-08 DIAGNOSIS — Z79899 Other long term (current) drug therapy: Secondary | ICD-10-CM | POA: Insufficient documentation

## 2020-11-08 DIAGNOSIS — E119 Type 2 diabetes mellitus without complications: Secondary | ICD-10-CM | POA: Insufficient documentation

## 2020-11-08 DIAGNOSIS — Z7984 Long term (current) use of oral hypoglycemic drugs: Secondary | ICD-10-CM | POA: Diagnosis not present

## 2020-11-08 DIAGNOSIS — F17200 Nicotine dependence, unspecified, uncomplicated: Secondary | ICD-10-CM | POA: Diagnosis not present

## 2020-11-08 DIAGNOSIS — Z7982 Long term (current) use of aspirin: Secondary | ICD-10-CM | POA: Diagnosis not present

## 2020-11-08 DIAGNOSIS — I10 Essential (primary) hypertension: Secondary | ICD-10-CM | POA: Insufficient documentation

## 2020-11-08 DIAGNOSIS — R109 Unspecified abdominal pain: Secondary | ICD-10-CM | POA: Insufficient documentation

## 2020-11-08 DIAGNOSIS — E278 Other specified disorders of adrenal gland: Secondary | ICD-10-CM

## 2020-11-08 DIAGNOSIS — R52 Pain, unspecified: Secondary | ICD-10-CM

## 2020-11-08 LAB — CBC WITH DIFFERENTIAL/PLATELET
Abs Immature Granulocytes: 0.01 10*3/uL (ref 0.00–0.07)
Basophils Absolute: 0 10*3/uL (ref 0.0–0.1)
Basophils Relative: 0 %
Eosinophils Absolute: 0.1 10*3/uL (ref 0.0–0.5)
Eosinophils Relative: 1 %
HCT: 45.9 % (ref 36.0–46.0)
Hemoglobin: 14.2 g/dL (ref 12.0–15.0)
Immature Granulocytes: 0 %
Lymphocytes Relative: 47 %
Lymphs Abs: 2.6 10*3/uL (ref 0.7–4.0)
MCH: 23.6 pg — ABNORMAL LOW (ref 26.0–34.0)
MCHC: 30.9 g/dL (ref 30.0–36.0)
MCV: 76.2 fL — ABNORMAL LOW (ref 80.0–100.0)
Monocytes Absolute: 0.4 10*3/uL (ref 0.1–1.0)
Monocytes Relative: 8 %
Neutro Abs: 2.5 10*3/uL (ref 1.7–7.7)
Neutrophils Relative %: 44 %
Platelets: 285 10*3/uL (ref 150–400)
RBC: 6.02 MIL/uL — ABNORMAL HIGH (ref 3.87–5.11)
RDW: 16.2 % — ABNORMAL HIGH (ref 11.5–15.5)
WBC: 5.7 10*3/uL (ref 4.0–10.5)
nRBC: 0 % (ref 0.0–0.2)

## 2020-11-08 LAB — TROPONIN I (HIGH SENSITIVITY)
Troponin I (High Sensitivity): 5 ng/L (ref <18)
Troponin I (High Sensitivity): 4 ng/L (ref <18)

## 2020-11-08 LAB — URINALYSIS, ROUTINE W REFLEX MICROSCOPIC
Bacteria, UA: NONE SEEN
Bilirubin Urine: NEGATIVE
Glucose, UA: NEGATIVE mg/dL
Ketones, ur: NEGATIVE mg/dL
Leukocytes,Ua: NEGATIVE
Nitrite: NEGATIVE
Protein, ur: NEGATIVE mg/dL
Specific Gravity, Urine: 1.004 — ABNORMAL LOW (ref 1.005–1.030)
pH: 6 (ref 5.0–8.0)

## 2020-11-08 LAB — COMPREHENSIVE METABOLIC PANEL
ALT: 16 U/L (ref 0–44)
AST: 19 U/L (ref 15–41)
Albumin: 4 g/dL (ref 3.5–5.0)
Alkaline Phosphatase: 52 U/L (ref 38–126)
Anion gap: 11 (ref 5–15)
BUN: 8 mg/dL (ref 8–23)
CO2: 27 mmol/L (ref 22–32)
Calcium: 9.3 mg/dL (ref 8.9–10.3)
Chloride: 98 mmol/L (ref 98–111)
Creatinine, Ser: 0.78 mg/dL (ref 0.44–1.00)
GFR, Estimated: >60 mL/min (ref >60)
Glucose, Bld: 121 mg/dL — ABNORMAL HIGH (ref 70–99)
Potassium: 3.4 mmol/L — ABNORMAL LOW (ref 3.5–5.1)
Sodium: 136 mmol/L (ref 135–145)
Total Bilirubin: 0.5 mg/dL (ref 0.3–1.2)
Total Protein: 7.7 g/dL (ref 6.5–8.1)

## 2020-11-08 MED ORDER — IOHEXOL 300 MG/ML  SOLN
100.0000 mL | Freq: Once | INTRAMUSCULAR | Status: AC | PRN
  Administered 2020-11-08: 100 mL via INTRAVENOUS

## 2020-11-08 MED ORDER — LIDOCAINE 5 % EX PTCH: 1.0000 | MEDICATED_PATCH | CUTANEOUS | 0 refills | Status: DC

## 2020-11-08 MED ORDER — CYCLOBENZAPRINE HCL 10 MG PO TABS: 10.0000 mg | ORAL_TABLET | Freq: Two times a day (BID) | ORAL | 0 refills | Status: DC | PRN

## 2020-11-08 MED ORDER — IOHEXOL 9 MG/ML PO SOLN
ORAL | Status: AC
  Filled 2020-11-08: qty 1000

## 2020-11-08 MED ORDER — GADOBUTROL 1 MMOL/ML IV SOLN
10.0000 mL | Freq: Once | INTRAVENOUS | Status: AC | PRN
  Administered 2020-11-08: 10 mL via INTRAVENOUS

## 2020-11-08 NOTE — ED Notes (Signed)
Pt was informed that we need a urine specimen.  

## 2020-11-08 NOTE — ED Notes (Signed)
Patient transported to CT 

## 2020-11-08 NOTE — Discharge Instructions (Addendum)
You were evaluated in the Emergency Department and after careful evaluation, we did not find any emergent condition requiring admission or further testing in the hospital.  Your exam/testing today was overall reassuring. You have a small mass on your adrenal gland.  It is very important that you tell your primary care doctor about this so that you can have repeat imaging to see if the mass is changing over time.    Please return to the Emergency Department if you experience any worsening of your condition.  Thank you for allowing [place] to be a part of your care.

## 2020-11-08 NOTE — ED Provider Notes (Signed)
  Provider Note MRN:  [id number]  Arrival date & time: 11/08/20    ED Course and Medical Decision Making  Assumed care from Dr. Estell Harpin at shift change.  Imaging reveals adrenal mass of uncertain etiology, possibly with some internal hemorrhage within the mass.  No hemodynamic instability, appropriate for dc with return precautions.  Procedures  Final Clinical Impressions(s) / ED Diagnoses     ICD-10-CM   1. Flank pain  R10.9   2. Pain  R52 DG Chest 1 View    DG Chest 1 View  3. 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  Your exam/testing today was overall reassuring. You have a small mass on your adrenal gland.  It is very important that you tell your primary care doctor about this so that you can have repeat imaging to see if the mass is changing over time.    Please return to the Emergency Department if you experience any worsening of your condition.  Thank you for allowing [place] to be a part of your care.     Elmer Sow. Pilar Plate, MD [facility] Emergency Medicine [facility] [email or web address]    Sabas Sous, MD 11/08/20 2007

## 2020-11-08 NOTE — ED Provider Notes (Signed)
[facility] EMERGENCY DEPARTMENT Provider Note   CSN: [id number] Arrival date & time: 11/08/20  0800     History Chief Complaint  Patient presents with  . Flank Pain    [name] is a 64 y.o. female.   Flank Pain       Past Medical History:  Diagnosis Date  . Diabetes mellitus   . GERD (gastroesophageal reflux disease)   . Hypercholesterolemia   . Hypertension     Patient Active Problem List   Diagnosis Date Noted  . Multinodular goiter 01/29/2018  . Uncontrolled type 2 diabetes mellitus with hyperglycemia (HCC) 01/29/2018  . Mixed hyperlipidemia 01/29/2018  . Essential hypertension, benign 01/29/2018  . Current smoker 01/29/2018    Past Surgical History:  Procedure Laterality Date  . CESAREAN SECTION  1989   [place]  . COLONOSCOPY  07/24/2012   Procedure: COLONOSCOPY;  Surgeon: Dalia Heading, MD;  Location: [facility];  Service: Gastroenterology;  Laterality: N/A;  . KNEE ARTHROSCOPY     left     OB History   No obstetric history on file.     Family History  Problem Relation Age of Onset  . Diabetes Mother   . Hypertension Mother   . Diabetes Sister   . Hypertension Sister   . Heart attack Sister     Social History   Tobacco Use  . Smoking status: Current Every Day Smoker    Packs/day: 1.00    Years: 20.00    Pack years: 20.00  . Smokeless tobacco: Never Used  Substance Use Topics  . Alcohol use: No  . Drug use: No    Home Medications Prior to Admission medications   Medication Sig Start Date End Date Taking? Authorizing Provider  aspirin EC 81 MG tablet Take 81 mg by mouth daily.   Yes [provider]  hydrochlorothiazide (HYDRODIURIL) 25 MG tablet Take 25 mg by mouth daily.   Yes [provider]  metFORMIN (GLUCOPHAGE) 1000 MG tablet Take 1,000 mg by mouth 2 (two) times daily.    Yes [provider]  pravastatin (PRAVACHOL) 10 MG tablet Take 10 mg by mouth daily. 11/22/19  Yes [provider]  sitaGLIPtin (JANUVIA) 100 MG tablet Take 100 mg by mouth daily.    Yes [provider]    Allergies    Patient has no known allergies.  Review of Systems   Review of Systems  Genitourinary: Positive for flank pain.    Physical Exam Updated Vital Signs BP (!) 159/76   Pulse 60   Temp 98.2 F (36.8 C) (Oral)   Resp (!) 24   Ht 5\' 8"  (1.727 m)   Wt 104.3 kg   SpO2 99%   BMI 34.97 kg/m   Physical Exam  ED Results / Procedures / Treatments   Labs (all labs ordered are listed, but only abnormal results are displayed) Labs Reviewed  CBC WITH DIFFERENTIAL/PLATELET - Abnormal; Notable for the following components:      Result Value   RBC 6.02 (*)    MCV 76.2 (*)    MCH 23.6 (*)    RDW 16.2 (*)    All other components within normal limits  COMPREHENSIVE METABOLIC PANEL - Abnormal; Notable for the following components:   Potassium 3.4 (*)    Glucose, Bld 121 (*)    All other components within normal limits  URINALYSIS, ROUTINE W REFLEX MICROSCOPIC - Abnormal; Notable for the following components:   Color, Urine STRAW (*)  Specific Gravity, Urine 1.004 (*)    Hgb urine dipstick SMALL (*)    All other components within normal limits  TROPONIN I (HIGH SENSITIVITY)  TROPONIN I (HIGH SENSITIVITY)    EKG EKG Interpretation  Date/Time:  Wednesday November 08 2020 08:17:19 EST Ventricular Rate:  67 PR Interval:  158 QRS Duration: 82 QT Interval:  430 QTC Calculation: 454 R Axis:   48 Text Interpretation: Sinus rhythm with Premature atrial complexes Possible Left atrial enlargement Septal infarct , age undetermined Abnormal ECG Confirmed by Bethann Berkshire [phone]) on 11/08/2020 10:39:07 AM   Radiology DG Chest 1 View  Result Date: 11/08/2020 CLINICAL DATA:  pain EXAM: CHEST  1 VIEW COMPARISON:  02/21/2020 chest radiograph and prior. FINDINGS: The heart size and mediastinal contours are within normal limits. Both lungs are clear. No pneumothorax  or pleural effusion. No acute osseous abnormality. IMPRESSION: No acute airspace disease. Electronically Signed   By: Stana Bunting M.D.   On: 11/08/2020 11:07   MR ABDOMEN WO CONTRAST  Result Date: 11/08/2020 CLINICAL DATA:  Acute LEFT flank pain, history of abnormal CT EXAM: MRI ABDOMEN WITHOUT CONTRAST TECHNIQUE: Multiplanar multisequence MR imaging was performed without the administration of intravenous contrast. Only T2 weighted imaging was acquired as the patient was unable to continue examination COMPARISON:  CT evaluation of November 08, 2020, obtained without contrast FINDINGS: Lower chest: Incidental imaging of the lung bases are limited on MR. No effusion. No consolidation. Hepatobiliary: Liver normal in size and contour. No focal suspicious lesion on very limited noncontrast imaging. No biliary duct dilation. No pericholecystic stranding. Pancreas: Grossly normal without signs of inflammation or ductal dilation very limited assessment. Spleen:  Normal Adrenals/Urinary Tract: LEFT adrenal lesion shows heterogeneous features on T2 weighted imaging measuring approximately 2.7 x 2.9 cm. Only T2 weighted imaging was acquired. This appears less conspicuous on fat saturated images but there is motion artifact limiting assessment in this area. The RIGHT adrenal gland is grossly normal. There are cysts in the RIGHT kidney and there is no hydronephrosis. Stomach/Bowel: No acute gastrointestinal process to the extent evaluated. Study not performed for bowel evaluation with only limited sequences acquired. Vascular/Lymphatic: Vascular structures with grossly normal caliber. No signs of abdominal adenopathy to the extent evaluated on today's study. Other:  No ascites Musculoskeletal: Is no visible destructive bone lesions. IMPRESSION: 1. LEFT adrenal lesion shows heterogeneous features on T2 weighted imaging measuring approximately 2.7 x 2.9 cm. Differential remains broad given that the patient could not  complete the evaluation. Hemorrhage into existing adenoma, hemorrhage into a metastatic focus in the LEFT adrenal or primary adrenal neoplasm such as pheochromocytoma are all considered. Completion of MR assessment may be helpful. Given difficulties in acquiring these MRI images follow-up with adrenal protocol CT may be beneficial along with biochemical assessment to exclude pheochromocytoma or functioning adrenal cortical neoplasm. Electronically Signed   By: Donzetta Kohut M.D.   On: 11/08/2020 14:58   CT Renal Stone Study  Result Date: 11/08/2020 CLINICAL DATA:  Acute left flank pain. EXAM: CT ABDOMEN AND PELVIS WITHOUT CONTRAST TECHNIQUE: Multidetector CT imaging of the abdomen and pelvis was performed following the standard protocol without IV contrast. COMPARISON:  None. FINDINGS: Lower chest: No acute abnormality. Hepatobiliary: No focal liver abnormality is seen. No gallstones, gallbladder wall thickening, or biliary dilatation. Pancreas: Unremarkable. No pancreatic ductal dilatation or surrounding inflammatory changes. Spleen: Normal in size without focal abnormality. Adrenals/Urinary Tract: 3 cm solid left adrenal mass is noted. Right adrenal gland is unremarkable.  Kidneys are unremarkable. No hydronephrosis or renal obstruction is noted. No renal or ureteral calculi are noted. Urinary bladder is unremarkable. Stomach/Bowel: Stomach is within normal limits. Appendix appears normal. No evidence of bowel wall thickening, distention, or inflammatory changes. Vascular/Lymphatic: Aortic atherosclerosis. No enlarged abdominal or pelvic lymph nodes. Reproductive: Uterus and bilateral adnexa are unremarkable. Other: No abdominal wall hernia or abnormality. No abdominopelvic ascites. Musculoskeletal: No acute or significant osseous findings. IMPRESSION: 1. 3 cm solid left adrenal mass is noted. MRI or CT scan with intravenous contrast is recommended for further evaluation. 2. No hydronephrosis or renal  obstruction is noted. No renal or ureteral calculi are noted. 3. Aortic atherosclerosis. Aortic Atherosclerosis (ICD10-I70.0). Electronically Signed   By: Lupita Raider M.D.   On: 11/08/2020 11:00    Procedures Procedures (including critical care time)  Medications Ordered in ED Medications  gadobutrol (GADAVIST) 1 MMOL/ML injection 10 mL (10 mLs Intravenous Contrast Given 11/08/20 1435)    ED Course  I have reviewed the triage vital signs and the nursing notes.  Pertinent labs & imaging results that were available during my care of the patient were reviewed by me and considered in my medical decision making (see chart for details).    MDM Rules/Calculators/A&P                           Final Clinical Impression(s) / ED Diagnoses Final diagnoses:  Pain    Rx / DC Orders ED Discharge Orders    None       Bethann Berkshire, MD 11/13/20 1003

## 2020-11-08 NOTE — ED Triage Notes (Addendum)
Pt reports left flank pain since Sunday. Reports pain started under left breast and moved around to left flank.  Says feels sore under left breast.   Denies urinary symptoms.  Denies any n/v/d.

## 2020-11-08 NOTE — ED Provider Notes (Signed)
[facility] EMERGENCY DEPARTMENT Provider Note   CSN: [id number] Arrival date & time: 11/08/20  0800     History Chief Complaint  Patient presents with  . Flank Pain    [name] is a 64 y.o. female.  Patient with left flank pain for a few days  The history is provided by the patient and medical records. No language interpreter was used.  Flank Pain This is a new problem. The problem occurs constantly. The problem has not changed since onset.Pertinent negatives include no chest pain, no abdominal pain and no headaches. Nothing aggravates the symptoms. Nothing relieves the symptoms. She has tried nothing for the symptoms.       Past Medical History:  Diagnosis Date  . Diabetes mellitus   . GERD (gastroesophageal reflux disease)   . Hypercholesterolemia   . Hypertension     Patient Active Problem List   Diagnosis Date Noted  . Multinodular goiter 01/29/2018  . Uncontrolled type 2 diabetes mellitus with hyperglycemia (HCC) 01/29/2018  . Mixed hyperlipidemia 01/29/2018  . Essential hypertension, benign 01/29/2018  . Current smoker 01/29/2018    Past Surgical History:  Procedure Laterality Date  . CESAREAN SECTION  1989   [place]  . COLONOSCOPY  07/24/2012   Procedure: COLONOSCOPY;  Surgeon: Dalia HeadingMark A Jenkins, MD;  Location: [facility];  Service: Gastroenterology;  Laterality: N/A;  . KNEE ARTHROSCOPY     left     OB History   No obstetric history on file.     Family History  Problem Relation Age of Onset  . Diabetes Mother   . Hypertension Mother   . Diabetes Sister   . Hypertension Sister   . Heart attack Sister     Social History   Tobacco Use  . Smoking status: Current Every Day Smoker    Packs/day: 1.00    Years: 20.00    Pack years: 20.00  . Smokeless tobacco: Never Used  Substance Use Topics  . Alcohol use: No  . Drug use: No    Home Medications Prior to Admission medications   Medication Sig Start Date End Date Taking?  Authorizing Provider  aspirin EC 81 MG tablet Take 81 mg by mouth daily.   Yes [provider]  hydrochlorothiazide (HYDRODIURIL) 25 MG tablet Take 25 mg by mouth daily.   Yes [provider]  metFORMIN (GLUCOPHAGE) 1000 MG tablet Take 1,000 mg by mouth 2 (two) times daily.    Yes [provider]  pravastatin (PRAVACHOL) 10 MG tablet Take 10 mg by mouth daily. 11/22/19  Yes [provider]  sitaGLIPtin (JANUVIA) 100 MG tablet Take 100 mg by mouth daily.    Yes [provider]    Allergies    Patient has no known allergies.  Review of Systems   Review of Systems  Constitutional: Negative for appetite change and fatigue.  HENT: Negative for congestion, ear discharge and sinus pressure.   Eyes: Negative for discharge.  Respiratory: Negative for cough.   Cardiovascular: Negative for chest pain.  Gastrointestinal: Negative for abdominal pain and diarrhea.  Genitourinary: Positive for flank pain. Negative for frequency and hematuria.  Musculoskeletal: Negative for back pain.  Skin: Negative for rash.  Neurological: Negative for seizures and headaches.  Psychiatric/Behavioral: Negative for hallucinations.    Physical Exam Updated Vital Signs BP (!) 159/76   Pulse 60   Temp 98.2 F (36.8 C) (Oral)   Resp (!) 24   Ht 5\' 8"  (1.727 m)  Wt 104.3 kg   SpO2 99%   BMI 34.97 kg/m   Physical Exam Vitals and nursing note reviewed.  Constitutional:      Appearance: She is well-developed.  HENT:     Head: Normocephalic.     Nose: Nose normal.  Eyes:     General: No scleral icterus.    Conjunctiva/sclera: Conjunctivae normal.  Neck:     Thyroid: No thyromegaly.  Cardiovascular:     Rate and Rhythm: Normal rate and regular rhythm.     Heart sounds: No murmur heard.  No friction rub. No gallop.   Pulmonary:     Breath sounds: No stridor. No wheezing or rales.  Chest:     Chest wall: No tenderness.  Abdominal:     General: There is no  distension.     Tenderness: There is no abdominal tenderness. There is no rebound.  Musculoskeletal:        General: Normal range of motion.     Cervical back: Neck supple.  Lymphadenopathy:     Cervical: No cervical adenopathy.  Skin:    Findings: No erythema or rash.  Neurological:     Mental Status: She is alert and oriented to person, place, and time.     Motor: No abnormal muscle tone.     Coordination: Coordination normal.  Psychiatric:        Behavior: Behavior normal.     ED Results / Procedures / Treatments   Labs (all labs ordered are listed, but only abnormal results are displayed) Labs Reviewed  CBC WITH DIFFERENTIAL/PLATELET - Abnormal; Notable for the following components:      Result Value   RBC 6.02 (*)    MCV 76.2 (*)    MCH 23.6 (*)    RDW 16.2 (*)    All other components within normal limits  COMPREHENSIVE METABOLIC PANEL - Abnormal; Notable for the following components:   Potassium 3.4 (*)    Glucose, Bld 121 (*)    All other components within normal limits  URINALYSIS, ROUTINE W REFLEX MICROSCOPIC - Abnormal; Notable for the following components:   Color, Urine STRAW (*)    Specific Gravity, Urine 1.004 (*)    Hgb urine dipstick SMALL (*)    All other components within normal limits  TROPONIN I (HIGH SENSITIVITY)  TROPONIN I (HIGH SENSITIVITY)    EKG EKG Interpretation  Date/Time:  Wednesday November 08 2020 08:17:19 EST Ventricular Rate:  67 PR Interval:  158 QRS Duration: 82 QT Interval:  430 QTC Calculation: 454 R Axis:   48 Text Interpretation: Sinus rhythm with Premature atrial complexes Possible Left atrial enlargement Septal infarct , age undetermined Abnormal ECG Confirmed by Bethann Berkshire [phone]) on 11/08/2020 10:39:07 AM   Radiology DG Chest 1 View  Result Date: 11/08/2020 CLINICAL DATA:  pain EXAM: CHEST  1 VIEW COMPARISON:  02/21/2020 chest radiograph and prior. FINDINGS: The heart size and mediastinal contours are within  normal limits. Both lungs are clear. No pneumothorax or pleural effusion. No acute osseous abnormality. IMPRESSION: No acute airspace disease. Electronically Signed   By: Stana Bunting M.D.   On: 11/08/2020 11:07   MR ABDOMEN WO CONTRAST  Result Date: 11/08/2020 CLINICAL DATA:  Acute LEFT flank pain, history of abnormal CT EXAM: MRI ABDOMEN WITHOUT CONTRAST TECHNIQUE: Multiplanar multisequence MR imaging was performed without the administration of intravenous contrast. Only T2 weighted imaging was acquired as the patient was unable to continue examination COMPARISON:  CT evaluation of November 08, 2020,  obtained without contrast FINDINGS: Lower chest: Incidental imaging of the lung bases are limited on MR. No effusion. No consolidation. Hepatobiliary: Liver normal in size and contour. No focal suspicious lesion on very limited noncontrast imaging. No biliary duct dilation. No pericholecystic stranding. Pancreas: Grossly normal without signs of inflammation or ductal dilation very limited assessment. Spleen:  Normal Adrenals/Urinary Tract: LEFT adrenal lesion shows heterogeneous features on T2 weighted imaging measuring approximately 2.7 x 2.9 cm. Only T2 weighted imaging was acquired. This appears less conspicuous on fat saturated images but there is motion artifact limiting assessment in this area. The RIGHT adrenal gland is grossly normal. There are cysts in the RIGHT kidney and there is no hydronephrosis. Stomach/Bowel: No acute gastrointestinal process to the extent evaluated. Study not performed for bowel evaluation with only limited sequences acquired. Vascular/Lymphatic: Vascular structures with grossly normal caliber. No signs of abdominal adenopathy to the extent evaluated on today's study. Other:  No ascites Musculoskeletal: Is no visible destructive bone lesions. IMPRESSION: 1. LEFT adrenal lesion shows heterogeneous features on T2 weighted imaging measuring approximately 2.7 x 2.9 cm.  Differential remains broad given that the patient could not complete the evaluation. Hemorrhage into existing adenoma, hemorrhage into a metastatic focus in the LEFT adrenal or primary adrenal neoplasm such as pheochromocytoma are all considered. Completion of MR assessment may be helpful. Given difficulties in acquiring these MRI images follow-up with adrenal protocol CT may be beneficial along with biochemical assessment to exclude pheochromocytoma or functioning adrenal cortical neoplasm. Electronically Signed   By: Donzetta Kohut M.D.   On: 11/08/2020 14:58   CT Renal Stone Study  Result Date: 11/08/2020 CLINICAL DATA:  Acute left flank pain. EXAM: CT ABDOMEN AND PELVIS WITHOUT CONTRAST TECHNIQUE: Multidetector CT imaging of the abdomen and pelvis was performed following the standard protocol without IV contrast. COMPARISON:  None. FINDINGS: Lower chest: No acute abnormality. Hepatobiliary: No focal liver abnormality is seen. No gallstones, gallbladder wall thickening, or biliary dilatation. Pancreas: Unremarkable. No pancreatic ductal dilatation or surrounding inflammatory changes. Spleen: Normal in size without focal abnormality. Adrenals/Urinary Tract: 3 cm solid left adrenal mass is noted. Right adrenal gland is unremarkable. Kidneys are unremarkable. No hydronephrosis or renal obstruction is noted. No renal or ureteral calculi are noted. Urinary bladder is unremarkable. Stomach/Bowel: Stomach is within normal limits. Appendix appears normal. No evidence of bowel wall thickening, distention, or inflammatory changes. Vascular/Lymphatic: Aortic atherosclerosis. No enlarged abdominal or pelvic lymph nodes. Reproductive: Uterus and bilateral adnexa are unremarkable. Other: No abdominal wall hernia or abnormality. No abdominopelvic ascites. Musculoskeletal: No acute or significant osseous findings. IMPRESSION: 1. 3 cm solid left adrenal mass is noted. MRI or CT scan with intravenous contrast is recommended  for further evaluation. 2. No hydronephrosis or renal obstruction is noted. No renal or ureteral calculi are noted. 3. Aortic atherosclerosis. Aortic Atherosclerosis (ICD10-I70.0). Electronically Signed   By: Lupita Raider M.D.   On: 11/08/2020 11:00    Procedures Procedures (including critical care time)  Medications Ordered in ED Medications  gadobutrol (GADAVIST) 1 MMOL/ML injection 10 mL (10 mLs Intravenous Contrast Given 11/08/20 1435)    ED Course  I have reviewed the triage vital signs and the nursing notes.  Pertinent labs & imaging results that were available during my care of the patient were reviewed by me and considered in my medical decision making (see chart for details).    MDM Rules/Calculators/A&P  Labs unremarkable.  CT scan shows possible adrenal mass.  She tried to get an MRI but was unsuccessful so we will get a CT with contrast Final Clinical Impression(s) / ED Diagnoses Final diagnoses:  Pain    Rx / DC Orders ED Discharge Orders    None       Bethann Berkshire, MD 11/13/20 1002

## 2021-04-07 IMAGING — CT CT RENAL STONE PROTOCOL
2 of 4 series · 16 of 46 positions shown, 18 images · non-contrast
Comparison: None.

CLINICAL DATA: Acute left flank pain.

EXAM:
CT ABDOMEN AND PELVIS WITHOUT CONTRAST
TECHNIQUE: Multidetector CT imaging of the abdomen and pelvis was performed
following the standard protocol without IV contrast.

[Series 2: axial st · axial · 0.75mm/px · z∈[+917,+1332]mm · 13 of 93 slices shown, 15 images]
[im 5/93  soft-tissue]
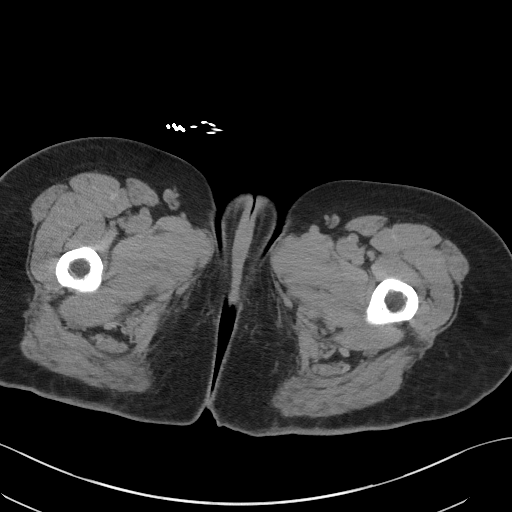
[im 5/93  bone]
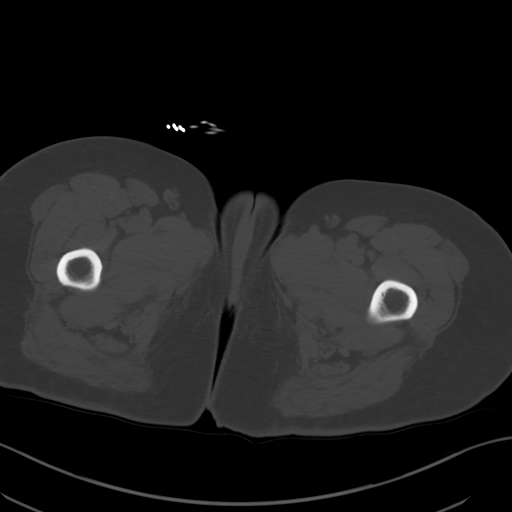
[im 14/93  soft-tissue]
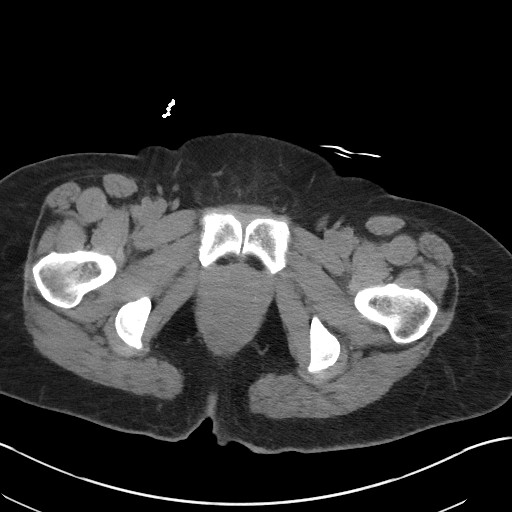
[im 19/93  soft-tissue]
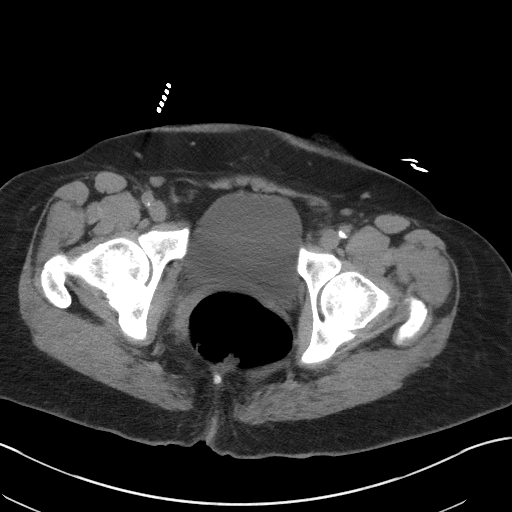
[im 28/93  soft-tissue]
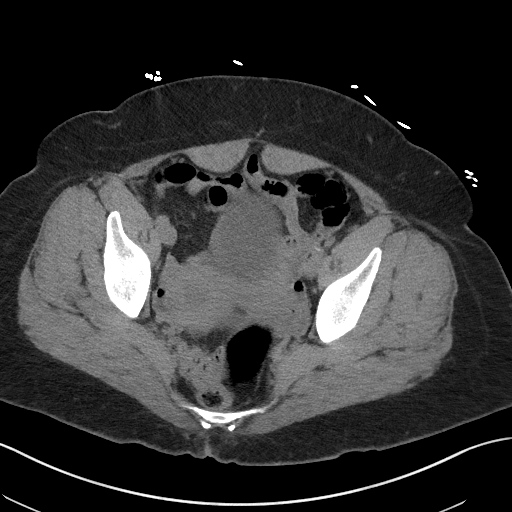
[im 33/93  soft-tissue]
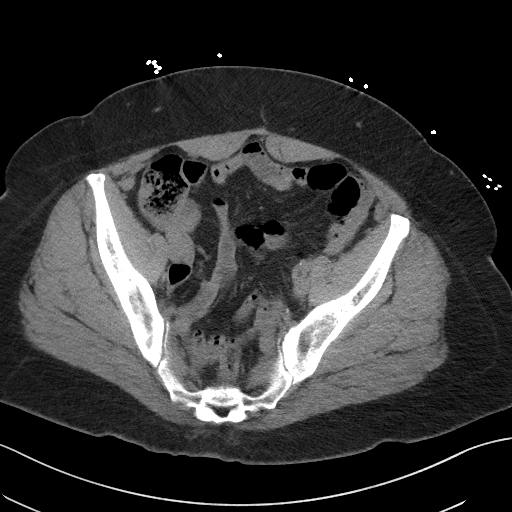
[im 42/93  soft-tissue]
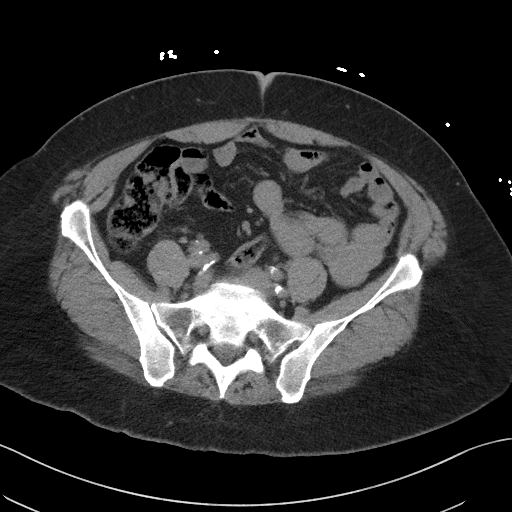
[im 47/93  soft-tissue]
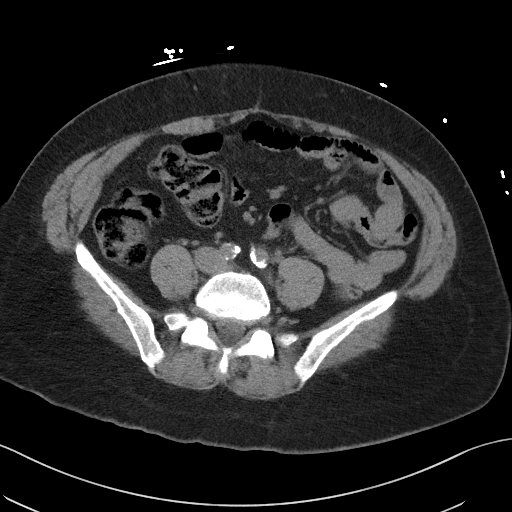
[im 51/93  soft-tissue]
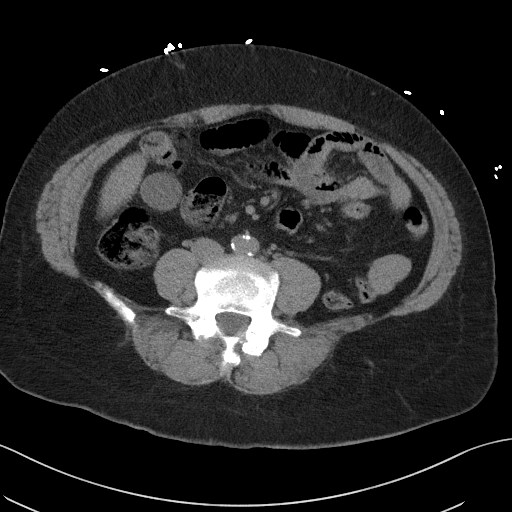
[im 60/93  soft-tissue]
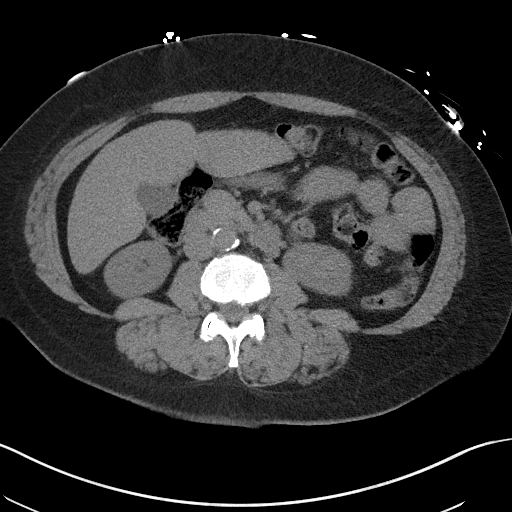
[im 60/93  bone]
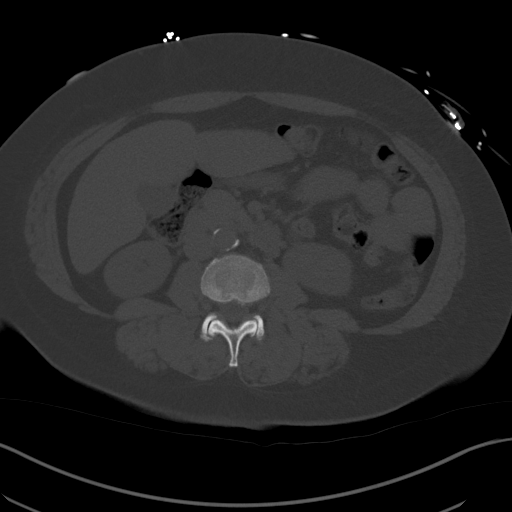
[im 65/93  soft-tissue]
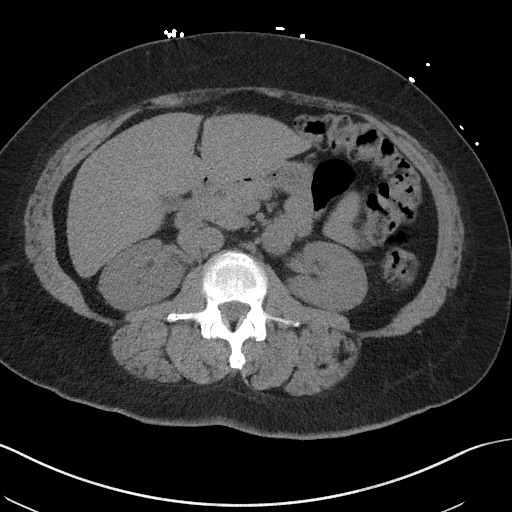
[im 74/93  soft-tissue]
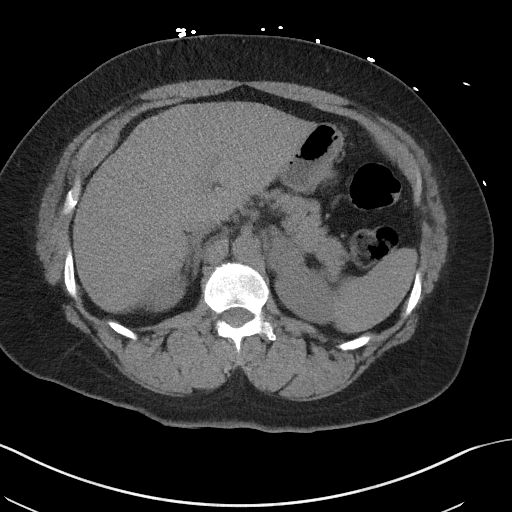
[im 79/93  soft-tissue]
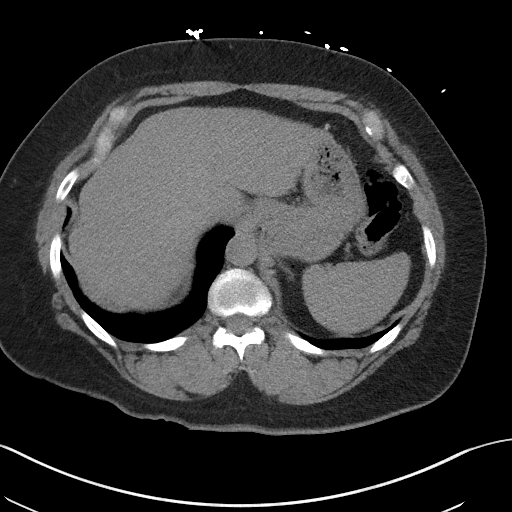
[im 88/93  soft-tissue]
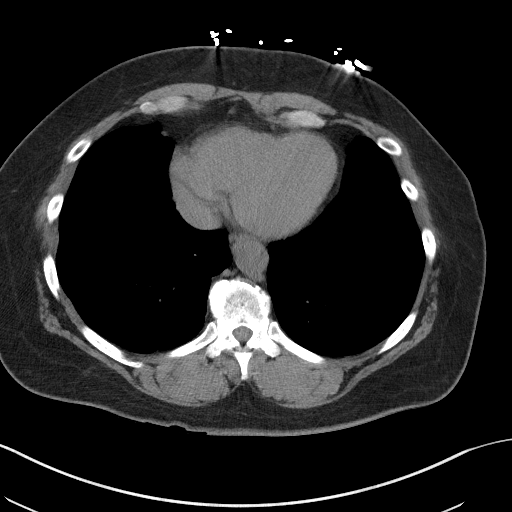

[Series 5: coronal st · coronal · 0.74mm/px · 3 of 98 slices shown]
[im 33/98  soft-tissue]
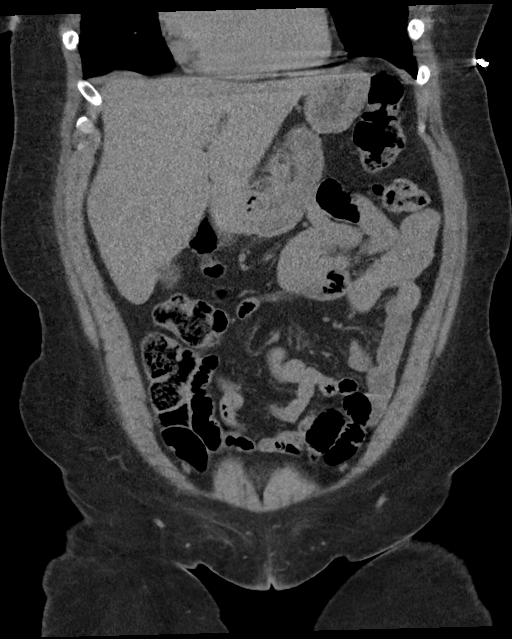
[im 44/98  soft-tissue]
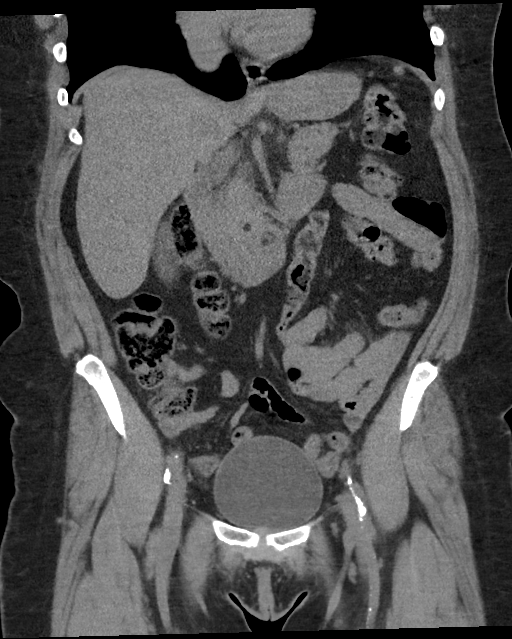
[im 54/98  soft-tissue]
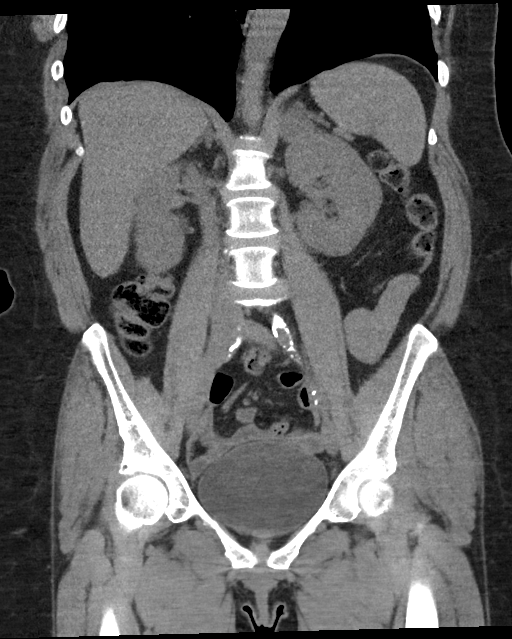

[16 of 46 positions shown; findings below may reference images not displayed]

FINDINGS: Lower chest: No acute abnormality.

Hepatobiliary: No focal liver abnormality is seen. No gallstones,
gallbladder wall thickening, or biliary dilatation.

Pancreas: Unremarkable. No pancreatic ductal dilatation or
surrounding inflammatory changes.

Spleen: Normal in size without focal abnormality.

Adrenals/Urinary Tract: 3 cm solid left adrenal mass is noted. Right
adrenal gland is unremarkable. Kidneys are unremarkable. No
hydronephrosis or renal obstruction is noted. No renal or ureteral
calculi are noted. Urinary bladder is unremarkable.

Stomach/Bowel: Stomach is within normal limits. Appendix appears
normal. No evidence of bowel wall thickening, distention, or
inflammatory changes.

Vascular/Lymphatic: Aortic atherosclerosis. No enlarged abdominal or
pelvic lymph nodes.

Reproductive: Uterus and bilateral adnexa are unremarkable.

Other: No abdominal wall hernia or abnormality. No abdominopelvic
ascites.

Musculoskeletal: No acute or significant osseous findings.
IMPRESSION: 1. 3 cm solid left adrenal mass is noted. MRI or CT scan with
intravenous contrast is recommended for further evaluation.
2. No hydronephrosis or renal obstruction is noted. No renal or
ureteral calculi are noted.
3. Aortic atherosclerosis.

Aortic Atherosclerosis ([id number]-QYX.X).

## 2022-05-14 ENCOUNTER — Encounter: Payer: Self-pay | Admitting: *Deleted

## 2022-09-18 ENCOUNTER — Encounter: Payer: Self-pay | Admitting: Obstetrics & Gynecology

## 2022-09-18 ENCOUNTER — Ambulatory Visit (INDEPENDENT_AMBULATORY_CARE_PROVIDER_SITE_OTHER): Payer: PRIVATE HEALTH INSURANCE | Admitting: Obstetrics & Gynecology

## 2022-09-18 ENCOUNTER — Other Ambulatory Visit (HOSPITAL_COMMUNITY)
Admission: RE | Admit: 2022-09-18 | Discharge: 2022-09-18 | Disposition: A | Discharge status: Home / Self Care | Payer: PRIVATE HEALTH INSURANCE | Source: Ambulatory Visit | Attending: Obstetrics & Gynecology | Admitting: Obstetrics & Gynecology

## 2022-09-18 ENCOUNTER — Encounter: Payer: PRIVATE HEALTH INSURANCE | Admitting: Obstetrics & Gynecology

## 2022-09-18 VITALS — BP 198/78 | HR 67 | Ht 68.0 in | Wt 206.4 lb

## 2022-09-18 DIAGNOSIS — N870 Mild cervical dysplasia: Secondary | ICD-10-CM | POA: Diagnosis not present

## 2022-09-18 DIAGNOSIS — Z720 Tobacco use: Secondary | ICD-10-CM

## 2022-09-18 DIAGNOSIS — R87618 Other abnormal cytological findings on specimens from cervix uteri: Secondary | ICD-10-CM | POA: Insufficient documentation

## 2022-09-18 DIAGNOSIS — R87612 Low grade squamous intraepithelial lesion on cytologic smear of cervix (LGSIL): Secondary | ICD-10-CM

## 2022-09-18 NOTE — Progress Notes (Signed)
    Patient name: [name] MRN [id number]  Date of birth: [birth year] Chief Complaint:   Colposcopy  History of Present Illness:   [name] is a 66 y.o. G3P0 PM female being seen today for cervical dysplasia management.  Denies vaginal bleeding, discharge, occasional itching after shaving.  No odor.  Denies pelvic or abdominal pain. Overall doing well and no acute complaints  Smoker:  Yes.   New sexual partner:  Yes.   X 2 yrs, sexually active for the past year  History of abnormal Pap: no  No LMP recorded. Patient is postmenopausal.     01/29/2018    9:16 AM  Depression screen PHQ 2/9  Decreased Interest 0  Down, Depressed, Hopeless 0  PHQ - 2 Score 0     Review of Systems:   Pertinent items are noted in HPI Denies fever/chills, dizziness, headaches, visual disturbances, fatigue, shortness of breath, chest pain, abdominal pain, vomiting, no problems wit bowel movements, urination, or intercourse unless otherwise stated above.  Pertinent History Reviewed:  Reviewed past medical,surgical, social, obstetrical and family history.  Reviewed problem list, medications and allergies. Physical Assessment:   Vitals:   09/18/22 0844  BP: (!) 198/78  Pulse: 67  Weight: 206 lb 6 oz (93.6 kg)  Height: 5\' 8"  (1.727 m)  Body mass index is 31.38 kg/m.       Physical Examination:   General appearance: alert, well appearing, and in no distress  Psych: mood appropriate, normal affect  Skin: warm & dry   Cardiovascular: normal heart rate noted  Respiratory: normal respiratory effort, no distress  Abdomen: soft, non-tender   Pelvic: VULVA: normal appearing vulva with no masses, tenderness or lesions, VAGINA: normal appearing vagina with normal color and discharge, no lesions, CERVIX: normal appearing cervix - see colposcopy section  Extremities: no edema, no calf tenderness bilaterally  Chaperone:  Andrez Grime      Colposcopy Procedure Note  Indications: LSIL, HPV  pos    Procedure Details  The risks and benefits of the procedure and Written informed consent obtained.  Speculum placed in vagina and excellent visualization of cervix achieved, cervix swabbed x 3 with acetic acid solution.  Findings: Adequate colposcopy is noted today.  TMZ zone present  Cervix: acetowhite changes noted, no discrete lesion noted ECC and cervical biopsies obtained.    Monsel's applied.  Adequate hemostasis noted  Specimens: ECC and cervical biopsies  Complications: none.  Colposcopic Impression: CIN 1   Plan(Based on 2019 ASCCP recommendations)  -Discussed HPV- reviewed incidence and its potential to cause condylomas to dysplasia to cervical cancer -Reviewed degree of abnormal pap smears  -Discussed ASCCP guidelines and current recommendations for colposcopy -As above, inform consent obtained and procedure completed -biopsies obtained, further management pending results -Questions and concerns were addressed  -encouraged smoking cessation as this is a risk factor for cervical cancer and progression of dysplasia  Janyth Pupa, DO Attending [place], [place] for [employer], [place]

## 2022-09-19 LAB — SURGICAL PATHOLOGY

## 2022-09-24 ENCOUNTER — Telehealth: Payer: Self-pay | Admitting: Obstetrics & Gynecology

## 2022-09-24 NOTE — Telephone Encounter (Signed)
Patient called back asking to speak to nurse about her test results

## 2022-11-12 ENCOUNTER — Encounter: Payer: Self-pay | Admitting: *Deleted

## 2022-12-05 ENCOUNTER — Ambulatory Visit: Payer: No Typology Code available for payment source | Attending: Internal Medicine | Admitting: Internal Medicine

## 2022-12-05 ENCOUNTER — Encounter: Payer: Self-pay | Admitting: Internal Medicine

## 2022-12-05 VITALS — BP 150/74 | HR 65 | Ht 68.0 in | Wt 213.0 lb

## 2022-12-05 DIAGNOSIS — G4700 Insomnia, unspecified: Secondary | ICD-10-CM

## 2022-12-05 DIAGNOSIS — R079 Chest pain, unspecified: Secondary | ICD-10-CM

## 2022-12-05 DIAGNOSIS — R0683 Snoring: Secondary | ICD-10-CM | POA: Diagnosis not present

## 2022-12-05 DIAGNOSIS — R29898 Other symptoms and signs involving the musculoskeletal system: Secondary | ICD-10-CM

## 2022-12-05 DIAGNOSIS — G4733 Obstructive sleep apnea (adult) (pediatric): Secondary | ICD-10-CM

## 2022-12-05 DIAGNOSIS — R0789 Other chest pain: Secondary | ICD-10-CM | POA: Insufficient documentation

## 2022-12-05 MED ORDER — METOPROLOL TARTRATE 25 MG PO TABS: ORAL_TABLET | ORAL | 0 refills | Status: DC

## 2022-12-05 NOTE — Progress Notes (Signed)
Cardiology Office Note  Date: 12/05/2022   ID: [name], DOB Apr 14, 1956, MRN [id number]  PCP:  Alvina Filbert, MD  Cardiologist:  None Electrophysiologist:  None   Reason for Office Visit: Evaluation of chest pain at the request of Dr. Durene Cal   History of Present Illness: [name] is a 66 y.o. female known to have HTN, DM 2, nicotine abuse, GERD was referred to cardiology clinic for evaluation of chest pain.  Patient is having substernal chest discomfort x couple of months, especially at rest, lasting for 10-15 min and resolve spontaneously. It is not as frequent. She did have chest pain in the past which was GERD related the quality of pain was different from the current episode. She does exercise, performs ADLs and does not have any symptoms of chest pain or DOE. Denied dizziness/lightheadedness, syncope, LE swelling. Smokes cigarettes, denied alcohol use and illicit drug abuse.   Past Medical History:  Diagnosis Date   Diabetes mellitus    GERD (gastroesophageal reflux disease)    Hypercholesterolemia    Hypertension     Past Surgical History:  Procedure Laterality Date   CESAREAN SECTION  1989   [place]   COLONOSCOPY  07/24/2012   Procedure: COLONOSCOPY;  Surgeon: Dalia Heading, MD;  Location: [facility];  Service: Gastroenterology;  Laterality: N/A;   KNEE ARTHROSCOPY     left    Current Outpatient Medications  Medication Sig Dispense Refill   aspirin EC 81 MG tablet Take 81 mg by mouth daily.     cyclobenzaprine (FLEXERIL) 10 MG tablet Take 1 tablet (10 mg total) by mouth 2 (two) times daily as needed for muscle spasms. 20 tablet 0   lisinopril-hydrochlorothiazide (ZESTORETIC) 10-12.5 MG tablet Take 1 tablet by mouth daily.     metFORMIN (GLUCOPHAGE) 1000 MG tablet Take 1,000 mg by mouth 2 (two) times daily.      metoprolol tartrate (LOPRESSOR) 25 MG tablet Take 1 tab 2 hours prior to CT 1 tablet 0   pravastatin (PRAVACHOL) 10 MG tablet Take 10 mg  by mouth daily.     sitaGLIPtin (JANUVIA) 100 MG tablet Take 100 mg by mouth daily.      lidocaine (LIDODERM) 5 % Place 1 patch onto the skin daily. Remove & Discard patch within 12 hours or as directed by MD (Patient not taking: Reported on 09/18/2022) 5 patch 0   No current facility-administered medications for this visit.   Allergies:  Patient has no known allergies.   Social History: The patient  reports that she has been smoking cigarettes. She has a 20.00 pack-year smoking history. She has never used smokeless tobacco. She reports that she does not drink alcohol and does not use drugs.   Family History: The patient's family history includes Diabetes in her mother and sister; Heart attack in her sister; Hypertension in her mother and sister.   ROS:  Please see the history of present illness. Otherwise, complete review of systems is positive for none.  All other systems are reviewed and negative.   Physical Exam: VS:  BP (!) 150/74 (BP Location: Left Arm, Patient Position: Sitting, Cuff Size: Large)   Pulse 65   Ht 5\' 8"  (1.727 m)   Wt 213 lb (96.6 kg)   BMI 32.39 kg/m , BMI Body mass index is 32.39 kg/m.  Wt Readings from Last 3 Encounters:  12/05/22 213 lb (96.6 kg)  09/18/22 206 lb 6 oz (93.6 kg)  11/08/20 230 lb (104.3 kg)  General: Patient appears comfortable at rest. HEENT: Conjunctiva and lids normal, oropharynx clear with moist mucosa. Neck: Supple, no elevated JVP or carotid bruits, no thyromegaly. Lungs: Clear to auscultation, nonlabored breathing at rest. Cardiac: Regular rate and rhythm, no S3 or significant systolic murmur, no pericardial rub. Abdomen: Soft, nontender, no hepatomegaly, bowel sounds present, no guarding or rebound. Extremities: No pitting edema, distal pulses 2+. Skin: Warm and dry. Musculoskeletal: No kyphosis. Neuropsychiatric: Alert and oriented x3, affect grossly appropriate.  ECG: Normal sinus rhythm and no ST-T changes  Recent  Labwork: No results found for requested labs within last 365 days.     Component Value Date/Time   CHOL 224 (H) 01/29/2018 1253   TRIG 141 01/29/2018 1253   HDL 42 (L) 01/29/2018 1253   CHOLHDL 5.3 (H) 01/29/2018 1253   VLDL 22 03/04/2009 0130   LDLCALC 155 (H) 01/29/2018 1253    Other Studies Reviewed Today:   Assessment and Plan: Patient is a 66 year old F known to have HTN, DM 2, nicotine abuse, GERD was referred to cardiology clinic for evaluation of chest pain.   # Chest pain -Patient has atypical features of chest pain and due to risk factors of HTN, DM 2 and smoking, she will benefit from CTA cardiac. No allergy to contrast dye.  She has labs pending with her PCP in the third week of January 2024. Schedule CT cardiac in the last week of January 2024.  # Leg weakness (pedal pulses palpable, 1+) -Obtain ABI with USG arterial Doppler lower extremities.  # OSA screening -Patient has OSA symptoms like snoring, morning headaches, morning labs, insomnia, nocturia. She benefit from [facility] testing but she preferred pulmonology referral for lab sleep study.  In addition, her atypical chest pains could also be secondary to untreated sleep apnea (if she has it).  # Nicotine abuse -Smoking cessation counseling provided. Smoking cessation instruction/counseling given:  counseled patient on the dangers of tobacco use, advised patient to stop smoking, and reviewed strategies to maximize success   I have spent a total of 41 minutes with patient reviewing chart, EKGs, labs and examining patient as well as establishing an assessment and plan that was discussed with the patient.  > 50% of time was spent in direct patient care.     Medication Adjustments/Labs and Tests Ordered: Current medicines are reviewed at length with the patient today.  Concerns regarding medicines are outlined above.   Tests Ordered: Orders Placed This Encounter  Procedures   CT CORONARY MORPH W/CTA COR W/SCORE W/CA  W/CM &/OR WO/CM   Ambulatory referral to Pulmonology   EKG 12-Lead   ECHOCARDIOGRAM COMPLETE   VAS [place] ABI WITH/WO TBI    Medication Changes: Meds ordered this encounter  Medications   metoprolol tartrate (LOPRESSOR) 25 MG tablet    Sig: Take 1 tab 2 hours prior to CT    Dispense:  1 tablet    Refill:  0    Disposition:  Follow up  6 months  Signed, Izan Miron Verne Spurr, MD, 12/05/2022 1:30 PM    [facility] at [facility] [address], [place], [place] [address]

## 2022-12-05 NOTE — Patient Instructions (Addendum)
Medication Instructions:  Metoprolol Tartrate 25 mg 1 tab 2 hours prior to CT procedure. *If you need a refill on your cardiac medications before your next appointment, please call your pharmacy*   Lab Work: NONE ordered at this time of appointment (completing at PCP 12/2022)  If you have labs (blood work) drawn today and your tests are completely normal, you will receive your results only by: MyChart Message (if you have MyChart) OR A paper copy in the mail If you have any lab test that is abnormal or we need to change your treatment, we will call you to review the results.   Testing/Procedures: Your physician has requested that you have an echocardiogram. Echocardiography is a painless test that uses sound waves to create images of your heart. It provides your doctor with information about the size and shape of your heart and how well your heart's chambers and valves are working. This procedure takes approximately one hour. There are no restrictions for this procedure. Please do NOT wear cologne, perfume, aftershave, or lotions (deodorant is allowed). Please arrive 15 minutes prior to your appointment time.  Your physician has requested that you have an ankle brachial index (ABI). During this test an ultrasound and blood pressure cuff are used to evaluate the arteries that supply the arms and legs with blood. Allow thirty minutes for this exam. There are no restrictions or special instructions.       Your cardiac CT will be scheduled at one of the below locations:   [facility] [address] [place], [place] [address] [phone]  OR  [facility] [address] [place], [place] [address] [phone]  OR   [facility] [address] [place], [place] [address] [phone]  If scheduled at [facility], please arrive at the [facility] and Children's Entrance (Entrance C2) of [facility] 30 minutes prior to test start time. You can use the FREE valet parking offered at entrance C (encouraged to control the heart rate for the test)  Proceed to the [facility] Radiology Department (first floor) to check-in and test prep.  All radiology patients and guests should use entrance C2 at [facility], accessed from [facility], even though the hospital's physical address listed is [address].    If scheduled at [facility] or [facility], please arrive 15 mins early for check-in and test prep.   Please follow these instructions carefully (unless otherwise directed):  Hold all erectile dysfunction medications at least 3 days (72 hrs) prior to test. (Ie viagra, cialis, sildenafil, tadalafil, etc) We will administer nitroglycerin during this exam.   On the Night Before the Test: Be sure to Drink plenty of water. Do not consume any caffeinated/decaffeinated beverages or chocolate 12 hours prior to your test. Do not take any antihistamines 12 hours prior to your test. If the patient has contrast allergy: Patient will need a prescription for Prednisone and very clear instructions (as follows): Prednisone 50 mg - take 13 hours prior to test Take another Prednisone 50 mg 7 hours prior to test Take another Prednisone 50 mg 1 hour prior to test Take Benadryl 50 mg 1 hour prior to test Patient must complete all four doses of above prophylactic medications. Patient will need a ride after test due to Benadryl.  On the Day of the Test: Drink plenty of water until 1 hour prior to the  test. Do not eat any food 1 hour prior to test. You may take your regular medications prior to the test.  Take metoprolol (Lopressor) two hours prior to test. HOLD Furosemide/Hydrochlorothiazide morning of the test. FEMALES- please wear underwire-free bra if available, avoid dresses & tight clothing   *For Clinical  Staff only. Please instruct patient the following:* Heart Rate Medication Recommendations for Cardiac CT  Resting HR < 50 bpm  No medication  Resting HR 50-60 bpm and BP >110/50 mmHG   Consider Metoprolol tartrate 25 mg PO 90-120 min prior to scan  Resting HR 60-65 bpm and BP >110/50 mmHG  Metoprolol tartrate 50 mg PO 90-120 minutes prior to scan   Resting HR > 65 bpm and BP >110/50 mmHG  Metoprolol tartrate 100 mg PO 90-120 minutes prior to scan  Consider Ivabradine 10-15 mg PO or a calcium channel blocker for resting HR >60 bpm and contraindication to metoprolol tartrate  Consider Ivabradine 10-15 mg PO in combination with metoprolol tartrate for HR >80 bpm         After the Test: Drink plenty of water. After receiving IV contrast, you may experience a mild flushed feeling. This is normal. On occasion, you may experience a mild rash up to 24 hours after the test. This is not dangerous. If this occurs, you can take Benadryl 25 mg and increase your fluid intake. If you experience trouble breathing, this can be serious. If it is severe call 911 IMMEDIATELY. If it is mild, please call our office. If you take any of these medications: Glipizide/Metformin, Avandament, Glucavance, please do not take 48 hours after completing test unless otherwise instructed.  We will call to schedule your test 2-4 weeks out understanding that some insurance companies will need an authorization prior to the service being performed.   For non-scheduling related questions, please contact the cardiac imaging nurse navigator should you have any questions/concerns: Rockwell Alexandria, Cardiac Imaging Nurse Navigator Larey Brick, Cardiac Imaging Nurse Navigator  Heart and Vascular Services Direct Office Dial: [phone]   For scheduling needs, including cancellations and rescheduling, please call [place], [phone].      Follow-Up: At [facility], you and your health needs are our  priority.  As part of our continuing mission to provide you with exceptional heart care, we have created designated Provider Care Teams.  These Care Teams include your primary Cardiologist (physician) and Advanced Practice Providers (APPs -  Physician Assistants and Nurse Practitioners) who all work together to provide you with the care you need, when you need it.  We recommend signing up for the patient portal called "MyChart".  Sign up information is provided on this After Visit Summary.  MyChart is used to connect with patients for Virtual Visits (Telemedicine).  Patients are able to view lab/test results, encounter notes, upcoming appointments, etc.  Non-urgent messages can be sent to your provider as well.   To learn more about what you can do with MyChart, go to [email or web address].    Your next appointment:   6 month(s)  The format for your next appointment:   In Person  Provider:   Luane School, MD    Other Instructions Referral sent to pulmonology  Important Information About Sugar

## 2022-12-06 DIAGNOSIS — R29898 Other symptoms and signs involving the musculoskeletal system: Secondary | ICD-10-CM | POA: Insufficient documentation

## 2022-12-06 DIAGNOSIS — G4733 Obstructive sleep apnea (adult) (pediatric): Secondary | ICD-10-CM | POA: Insufficient documentation

## 2022-12-18 ENCOUNTER — Other Ambulatory Visit: Payer: Self-pay | Admitting: Internal Medicine

## 2022-12-18 DIAGNOSIS — G4700 Insomnia, unspecified: Secondary | ICD-10-CM

## 2022-12-18 DIAGNOSIS — R0683 Snoring: Secondary | ICD-10-CM

## 2022-12-18 DIAGNOSIS — I739 Peripheral vascular disease, unspecified: Secondary | ICD-10-CM

## 2022-12-18 DIAGNOSIS — G4733 Obstructive sleep apnea (adult) (pediatric): Secondary | ICD-10-CM

## 2022-12-18 DIAGNOSIS — R079 Chest pain, unspecified: Secondary | ICD-10-CM

## 2022-12-18 DIAGNOSIS — R29898 Other symptoms and signs involving the musculoskeletal system: Secondary | ICD-10-CM

## 2022-12-26 ENCOUNTER — Other Ambulatory Visit: Payer: Self-pay | Admitting: Internal Medicine

## 2022-12-26 DIAGNOSIS — I739 Peripheral vascular disease, unspecified: Secondary | ICD-10-CM

## 2023-01-09 ENCOUNTER — Ambulatory Visit (HOSPITAL_COMMUNITY)
Admission: RE | Admit: 2023-01-09 | Discharge: 2023-01-09 | Disposition: A | Discharge status: Home / Self Care | Payer: PRIVATE HEALTH INSURANCE | Source: Ambulatory Visit | Attending: Internal Medicine | Admitting: Internal Medicine

## 2023-01-09 DIAGNOSIS — R079 Chest pain, unspecified: Secondary | ICD-10-CM | POA: Diagnosis present

## 2023-01-09 LAB — ECHOCARDIOGRAM COMPLETE
AR max vel: 2.01 cm2
AV Area VTI: 1.94 cm2
AV Area mean vel: 2.35 cm2
AV Mean grad: 6 mmHg
AV Peak grad: 16 mmHg
Ao pk vel: 2 m/s
Area-P 1/2: 2 cm2
S' Lateral: 2.5 cm

## 2023-01-09 NOTE — Progress Notes (Signed)
*  PRELIMINARY RESULTS* Echocardiogram 2D Echocardiogram has been performed.  [name] 01/09/2023, 9:23 AM

## 2023-01-10 ENCOUNTER — Telehealth: Payer: Self-pay | Admitting: Internal Medicine

## 2023-01-10 NOTE — Telephone Encounter (Signed)
Pt returning call for echo results  

## 2023-01-10 NOTE — Telephone Encounter (Signed)
Mallipeddi, Vishnu P, MD  Normal Echo and moderate LVH. Continue current plan.   Pt notified,copied pcp

## 2023-02-10 ENCOUNTER — Ambulatory Visit: Payer: PRIVATE HEALTH INSURANCE | Attending: Internal Medicine

## 2023-02-10 DIAGNOSIS — I739 Peripheral vascular disease, unspecified: Secondary | ICD-10-CM | POA: Diagnosis not present

## 2023-02-11 LAB — VAS US LOWER EXT ART SEG MULTI (SEGMENTALS & LE RAYNAUDS)
Left ABI: 1.15
Right ABI: 1.16

## 2023-02-14 ENCOUNTER — Telehealth: Payer: Self-pay | Admitting: Internal Medicine

## 2023-02-14 NOTE — Telephone Encounter (Signed)
Pt called and said she had a scan done Monday, and has not heard anything regarding results. She states someone tried calling, but she isn't allowed to have her phone out so she had to call back.

## 2023-02-14 NOTE — Telephone Encounter (Signed)
Follow Up:    Patient would like her ultrasound results from Monday(02-10-23) please.

## 2023-02-14 NOTE — Telephone Encounter (Signed)
Message sent to provider to result test.

## 2023-02-17 NOTE — Telephone Encounter (Signed)
Patient notified and verbalized understanding. 

## 2023-02-17 NOTE — Telephone Encounter (Signed)
Left a message for patient to call office back for results.

## 2023-02-17 NOTE — Telephone Encounter (Signed)
Patient is returning call to discuss results. 

## 2023-02-20 ENCOUNTER — Telehealth: Payer: Self-pay

## 2023-02-20 NOTE — Telephone Encounter (Signed)
I called multi numbers on the patient chart and left voice mails about a referral that we received for the patient to be seen, I went ahead and scheduled the appointment and printed a letter to be mailed to patient. ep

## 2023-02-26 ENCOUNTER — Institutional Professional Consult (permissible substitution): Payer: PRIVATE HEALTH INSURANCE | Admitting: Pulmonary Disease

## 2023-03-04 ENCOUNTER — Encounter: Payer: PRIVATE HEALTH INSURANCE | Admitting: Obstetrics & Gynecology

## 2023-03-19 ENCOUNTER — Other Ambulatory Visit (HOSPITAL_COMMUNITY)
Admission: RE | Admit: 2023-03-19 | Discharge: 2023-03-19 | Disposition: A | Discharge status: Home / Self Care | Payer: PRIVATE HEALTH INSURANCE | Source: Ambulatory Visit

## 2023-03-19 ENCOUNTER — Encounter: Payer: Self-pay | Admitting: Obstetrics & Gynecology

## 2023-03-19 ENCOUNTER — Ambulatory Visit (INDEPENDENT_AMBULATORY_CARE_PROVIDER_SITE_OTHER): Payer: PRIVATE HEALTH INSURANCE | Admitting: Obstetrics & Gynecology

## 2023-03-19 ENCOUNTER — Other Ambulatory Visit (HOSPITAL_COMMUNITY)
Admission: RE | Admit: 2023-03-19 | Discharge: 2023-03-19 | Disposition: A | Discharge status: Home / Self Care | Payer: PRIVATE HEALTH INSURANCE | Source: Ambulatory Visit | Attending: Obstetrics & Gynecology | Admitting: Obstetrics & Gynecology

## 2023-03-19 VITALS — BP 138/71 | HR 82 | Ht 68.0 in | Wt 210.0 lb

## 2023-03-19 DIAGNOSIS — Z720 Tobacco use: Secondary | ICD-10-CM

## 2023-03-19 DIAGNOSIS — R3989 Other symptoms and signs involving the genitourinary system: Secondary | ICD-10-CM

## 2023-03-19 DIAGNOSIS — R87612 Low grade squamous intraepithelial lesion on cytologic smear of cervix (LGSIL): Secondary | ICD-10-CM | POA: Insufficient documentation

## 2023-03-19 DIAGNOSIS — N952 Postmenopausal atrophic vaginitis: Secondary | ICD-10-CM | POA: Diagnosis not present

## 2023-03-19 DIAGNOSIS — N950 Postmenopausal bleeding: Secondary | ICD-10-CM | POA: Diagnosis present

## 2023-03-19 DIAGNOSIS — N941 Unspecified dyspareunia: Secondary | ICD-10-CM

## 2023-03-19 NOTE — Progress Notes (Signed)
GYN VISIT Patient name: [name] MRN [id number]  Date of birth: June 23, 1956 Chief Complaint:   Colposcopy  History of Present Illness:   [name] is a 67 y.o. G3P3 PM female being seen today for the following concerns:  -Vaginal irritation:Noted blood when she wiped- last month- bright orange to red. Lasted for one day and resolved.   Seen at outside facility- it seems as though it was a suspected UTI, pt was treated with an antibiotic.  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  Since then now has a vaginal odor and irritation.  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  Denies dysuria, just feels like things haven't been right for the past few weeks.  Denies pelvic or abdominal pain.  No fever or chills.  Upon further questioning, she does note pain with sex.  Does not like using lubricants.   No LMP recorded. Patient is postmenopausal.     01/29/2018    9:16 AM  Depression screen PHQ 2/9  Decreased Interest 0  Down, Depressed, Hopeless 0  PHQ - 2 Score 0     Review of Systems:   Pertinent items are noted in HPI Denies fever/chills, dizziness, headaches, visual disturbances, fatigue, shortness of breath, chest pain, abdominal pain, vomiting. Pertinent History Reviewed:  Reviewed past medical,surgical, social, obstetrical and family history.  Reviewed problem list, medications and allergies. Physical Assessment:   Vitals:   03/19/23 1505  BP: 138/71  Pulse: 82  Weight: 210 lb (95.3 kg)  Height: 5\' 8"  (1.727 m)  Body mass index is 31.93 kg/m.       Physical Examination:   General appearance: alert, well appearing, and in no distress  Psych: mood appropriate, normal affect  Skin: warm & dry    Cardiovascular: normal heart rate noted  Respiratory: normal respiratory effort, no distress  Abdomen: soft, non-tender, no rebound, no guarding  Pelvic: VULVA: normal appearing vulva with no masses, tenderness or lesions, VAGINA: flat pink mucosa, minimal white discharge noted, CERVIX: see colposcopy section  Extremities: no edema   Chaperone: Faith Rogue     Colposcopy Procedure Note  Indications: LSIL, HPV+  Procedure Details  The risks and benefits of the procedure and Written informed consent obtained.  Speculum placed in vagina and excellent visualization of cervix achieved, cervix swabbed x 3 with acetic acid solution.  Findings: Adequate colposcopy is noted today.  TMZ zone visualized  Cervix: no visible lesions, minimal acteowhite changes, ECC obtained.  No bleeding noted  Specimens: ECC  Complications: none.  Colposcopic Impression: negative     Assessment & Plan:  1) Cervical dysplasia -reviewed ASCCP guidelines -if ECC negative, will plan for pap in 71yr -encouraged pt to work on tobacco cessation as this is a risk factor  2) ?postmenopausal bleeding -plan for pelvic US for evaluation of endometrium  3) Vaginal irritation -vaginitis panel obtained, further management pending results -concern for possible BV infection -will also recheck urine- discussed that blood may be present due to today's exam  4) Dyspareunia/vaginal atrophy -reviewed WHI study- pt denies h/o breast or uterine cancer.  Denies h.o VTE, stroke MI -discussed localized estrogen therapy to help relieve symptoms -additionally may help to improve dysplasia []  pending results of pelvic  US will consider treatment   Orders Placed This Encounter  Procedures   Urine Culture   US PELVIC COMPLETE WITH TRANSVAGINAL   Urinalysis, Routine w reflex microscopic    Return in about 1 year (around 03/18/2024) for Annual.   Myna Hidalgo, DO Attending Obstetrician & Gynecologist, Faculty  [facility] for [facility], [facility]

## 2023-03-20 LAB — URINALYSIS, ROUTINE W REFLEX MICROSCOPIC
Bilirubin, UA: NEGATIVE
Glucose, UA: NEGATIVE
Ketones, UA: NEGATIVE
Leukocytes,UA: NEGATIVE
Nitrite, UA: NEGATIVE
Protein,UA: NEGATIVE
Specific Gravity, UA: 1.008 (ref 1.005–1.030)
Urobilinogen, Ur: 0.2 mg/dL (ref 0.2–1.0)
pH, UA: 7 (ref 5.0–7.5)

## 2023-03-20 LAB — MICROSCOPIC EXAMINATION
Bacteria, UA: NONE SEEN
Casts: NONE SEEN /lpf
Epithelial Cells (non renal): NONE SEEN /hpf (ref 0–10)
RBC, Urine: NONE SEEN /hpf (ref 0–2)
WBC, UA: NONE SEEN /hpf (ref 0–5)

## 2023-03-21 LAB — URINE CULTURE

## 2023-03-21 LAB — CERVICOVAGINAL ANCILLARY ONLY
Bacterial Vaginitis (gardnerella): NEGATIVE
Candida Glabrata: NEGATIVE
Candida Vaginitis: NEGATIVE
Comment: NEGATIVE
Comment: NEGATIVE
Comment: NEGATIVE

## 2023-03-21 LAB — SURGICAL PATHOLOGY

## 2023-04-07 ENCOUNTER — Other Ambulatory Visit: Payer: PRIVATE HEALTH INSURANCE

## 2023-04-18 ENCOUNTER — Telehealth (HOSPITAL_COMMUNITY): Payer: Self-pay | Admitting: *Deleted

## 2023-04-18 NOTE — Telephone Encounter (Signed)
Attempted to call patient regarding upcoming cardiac CT appointment. ?Unable to leave a voicemail. ? ?Kerianne Gurr RN Navigator Cardiac Imaging ?[facility] ?[phone] Office ?[phone] Cell ? ?

## 2023-04-21 ENCOUNTER — Ambulatory Visit
Admission: RE | Admit: 2023-04-21 | Discharge: 2023-04-21 | Disposition: A | Discharge status: Home / Self Care | Payer: PRIVATE HEALTH INSURANCE | Source: Ambulatory Visit | Attending: Internal Medicine | Admitting: Internal Medicine

## 2023-04-21 DIAGNOSIS — R079 Chest pain, unspecified: Secondary | ICD-10-CM | POA: Insufficient documentation

## 2023-04-21 LAB — POCT I-STAT CREATININE: Creatinine, Ser: 0.8 mg/dL (ref 0.44–1.00)

## 2023-04-21 MED ORDER — NITROGLYCERIN 0.4 MG SL SUBL
0.8000 mg | SUBLINGUAL_TABLET | Freq: Once | SUBLINGUAL | Status: AC
  Administered 2023-04-21: 0.8 mg via SUBLINGUAL

## 2023-04-21 MED ORDER — IOHEXOL 350 MG/ML SOLN
100.0000 mL | Freq: Once | INTRAVENOUS | Status: AC | PRN
  Administered 2023-04-21: 100 mL via INTRAVENOUS

## 2023-04-21 NOTE — Progress Notes (Signed)
Pt completed procedure with no issues. Pt ABCs intact. Pt denies any complaints. Pt encouraged to drink plenty of fluids throughout the day. Pt ambulatory with steady gait.

## 2023-05-08 ENCOUNTER — Ambulatory Visit: Payer: PRIVATE HEALTH INSURANCE | Admitting: Pulmonary Disease

## 2023-05-08 ENCOUNTER — Encounter: Payer: Self-pay | Admitting: Pulmonary Disease

## 2023-05-08 VITALS — BP 167/71 | HR 70 | Ht 68.0 in | Wt 207.8 lb

## 2023-05-08 DIAGNOSIS — Z716 Tobacco abuse counseling: Secondary | ICD-10-CM | POA: Diagnosis not present

## 2023-05-08 DIAGNOSIS — R0683 Snoring: Secondary | ICD-10-CM | POA: Diagnosis not present

## 2023-05-08 NOTE — Patient Instructions (Signed)
Will arrange for a home sleep study Will call to arrange for follow up after sleep study reviewed  

## 2023-05-08 NOTE — Progress Notes (Signed)
[name], Critical Care, and Sleep Medicine  Chief Complaint  Patient presents with   Consult    Pt consult reports having daytime tiredness, snoring, and witnessed apneas. Never had a sleep study.    Past Surgical History:  She  has a past surgical history that includes Knee arthroscopy; Cesarean section (1989); and Colonoscopy (07/24/2012).  Past Medical History:  DM type 2, GERD, HLD, HTN  Constitutional:  BP (!) 167/71   Pulse 70   Ht 5\' 8"  (1.727 m)   Wt 207 lb 12.8 oz (94.3 kg)   SpO2 95%   BMI 31.60 kg/m   Brief Summary:  [name] is a 67 y.o. female smoker with snoring.       Subjective:   She has struggled with her sleep for several years and this is getting worse.  She can fall asleep easily.  She has trouble staying asleep.  She gets sleepy during the day and has trouble staying awake at work.  She snores and stops breathing while asleep.  She goes to sleep between 9 and 10 pm.  She falls asleep quickly.  She wakes up 3 or 4 times to use the bathroom.  She gets out of bed at 4 am.  She feels exhausted in the morning.  She denies morning headache.  She does not use anything to help her fall sleep or stay awake.  She denies sleep walking, sleep talking, bruxism, or nightmares.  There is no history of restless legs.  She denies sleep hallucinations, sleep paralysis, or cataplexy.  The Epworth score is 3 out of 24.  She started smoking at at 18 and has smoked up to 1 ppd.  Physical Exam:   Appearance - well kempt   ENMT - no sinus tenderness, no oral exudate, no LAN, Mallampati 2 airway, no stridor, decreased AP diameter  Respiratory - equal breath sounds bilaterally, no wheezing or rales  CV - s1s2 regular rate and rhythm, no murmurs  Ext - no clubbing, no edema  Skin - no rashes  Psych - normal mood and affect   Sleep Tests:    Cardiac Tests:  Echo 01/09/23 >> EF 60 to 65%, mod LVH, grade 1 DD  Social History:  She  reports  that she has been smoking cigarettes. She started smoking about 49 years ago. She has a 49.00 pack-year smoking history. She has never used smokeless tobacco. She reports that she does not drink alcohol and does not use drugs.  Family History:  Her family history includes Diabetes in her mother and sister; Heart attack in her sister; Hypertension in her mother and sister.    Discussion:  She has snoring, sleep disruption, apnea, nocturnal leg cramps, nocturia and daytime sleepiness.  She has history of hypertension.  I am concerned she could have obstructive sleep apnea.  Assessment/Plan:   Snoring with excessive daytime sleepiness. - will need to arrange for a home sleep study  Tobacco abuse. - spent 5 minutes discussing smoking cessation options - reviewed criteria for lung cancer screening program - she is vaping to help get away from tobacco cigarettes - she isn't ready to commit to lung cancer screening at this time  Obesity. - discussed how weight can impact sleep and risk for sleep disordered breathing - discussed options to assist with weight loss: combination of diet modification, cardiovascular and strength training exercises  Cardiovascular risk. - had an extensive discussion regarding the adverse health consequences related to untreated sleep disordered  breathing - specifically discussed the risks for hypertension, coronary artery disease, cardiac dysrhythmias, cerebrovascular disease, and diabetes - lifestyle modification discussed  Safe driving practices. - discussed how sleep disruption can increase risk of accidents, particularly when driving - safe driving practices were discussed  Therapies for obstructive sleep apnea. - if the sleep study shows significant sleep apnea, then various therapies for treatment were reviewed: CPAP, oral appliance, and surgical interventions  Time Spent Involved in Patient Care on Day of Examination:  50 minutes  Follow up:    Patient Instructions  Will arrange for a home sleep study Will call to arrange for follow up after sleep study reviewed   Medication List:   Allergies as of 05/08/2023   No Known Allergies      Medication List        Accurate as of May 08, 2023  8:48 AM. If you have any questions, ask your nurse or doctor.          aspirin EC 81 MG tablet Take 81 mg by mouth daily.   cyclobenzaprine 10 MG tablet Commonly known as: FLEXERIL Take 1 tablet (10 mg total) by mouth 2 (two) times daily as needed for muscle spasms.   doxycycline 100 MG capsule Commonly known as: VIBRAMYCIN Take 100 mg by mouth 2 (two) times daily.   lidocaine 5 % Commonly known as: Lidoderm Place 1 patch onto the skin daily. Remove & Discard patch within 12 hours or as directed by MD   lisinopril-hydrochlorothiazide 10-12.5 MG tablet Commonly known as: ZESTORETIC Take 1 tablet by mouth daily.   metFORMIN 1000 MG tablet Commonly known as: GLUCOPHAGE Take 1,000 mg by mouth 2 (two) times daily.   metoprolol tartrate 25 MG tablet Commonly known as: LOPRESSOR Take 1 tab 2 hours prior to CT   pravastatin 10 MG tablet Commonly known as: PRAVACHOL Take 10 mg by mouth daily.   sitaGLIPtin 100 MG tablet Commonly known as: JANUVIA Take 100 mg by mouth daily.        Signature:  Coralyn Helling, MD [facility] York/Critical Care Pager - [phone] 05/08/2023, 8:48 AM

## 2023-06-06 ENCOUNTER — Ambulatory Visit: Payer: Self-pay | Admitting: Internal Medicine

## 2023-06-06 ENCOUNTER — Ambulatory Visit: Payer: PRIVATE HEALTH INSURANCE | Admitting: Internal Medicine

## 2023-07-12 ENCOUNTER — Emergency Department (HOSPITAL_COMMUNITY)
Admission: EM | Admit: 2023-07-12 | Discharge: 2023-07-12 | Disposition: A | Discharge status: Home / Self Care | Payer: PRIVATE HEALTH INSURANCE | Attending: Emergency Medicine | Admitting: Emergency Medicine

## 2023-07-12 ENCOUNTER — Other Ambulatory Visit: Payer: Self-pay

## 2023-07-12 ENCOUNTER — Encounter (HOSPITAL_COMMUNITY): Payer: Self-pay | Admitting: *Deleted

## 2023-07-12 DIAGNOSIS — E119 Type 2 diabetes mellitus without complications: Secondary | ICD-10-CM | POA: Diagnosis not present

## 2023-07-12 DIAGNOSIS — Z79899 Other long term (current) drug therapy: Secondary | ICD-10-CM | POA: Diagnosis not present

## 2023-07-12 DIAGNOSIS — U071 COVID-19: Secondary | ICD-10-CM | POA: Insufficient documentation

## 2023-07-12 DIAGNOSIS — I10 Essential (primary) hypertension: Secondary | ICD-10-CM | POA: Diagnosis not present

## 2023-07-12 DIAGNOSIS — Z7984 Long term (current) use of oral hypoglycemic drugs: Secondary | ICD-10-CM | POA: Diagnosis not present

## 2023-07-12 DIAGNOSIS — Z7982 Long term (current) use of aspirin: Secondary | ICD-10-CM | POA: Insufficient documentation

## 2023-07-12 DIAGNOSIS — F17200 Nicotine dependence, unspecified, uncomplicated: Secondary | ICD-10-CM | POA: Insufficient documentation

## 2023-07-12 DIAGNOSIS — Z20822 Contact with and (suspected) exposure to covid-19: Secondary | ICD-10-CM | POA: Diagnosis present

## 2023-07-12 LAB — SARS CORONAVIRUS 2 BY RT PCR: SARS Coronavirus 2 by RT PCR: POSITIVE — AB

## 2023-07-12 NOTE — ED Notes (Signed)
Pt complains of fatigue, generalized body aches and dry cough for almost a week. Pt reports multiple people from her work currently out with covid. Respirations even and unlabored, pt in NAD.

## 2023-07-12 NOTE — ED Triage Notes (Signed)
Pt c/o cough and generalized body aches x 5 days; pt states she has taken 2 covid tests at home and both were negative

## 2023-07-12 NOTE — ED Provider Notes (Addendum)
[facility]ERGENCY DEPARTMENT AT [facility] Provider Note   CSN: [id number] Arrival date & time: 07/12/23  0102     History  Chief Complaint  Patient presents with   Generalized Body Aches    [name] is a 67 y.o. female.  Patient with exposure to COVID at work.  Patient has not been feeling well since Monday.  Cough congestion body aches joint pain little bit of diarrhea no vomiting.  Patient is done to home test for COVID and they were negative.  But patient's symptoms certainly do sound that they could be consistent with COVID.  Past medical history significant for hypertension high cholesterol diabetes gastroesophageal reflux disease.  Patient everyday smoker.  Oxygen saturations here 99% on room air.  Respiratory rate 18 temp 98.5.       Home Medications Prior to Admission medications   Medication Sig Start Date End Date Taking? Authorizing Provider  aspirin EC 81 MG tablet Take 81 mg by mouth daily.    [provider]  cyclobenzaprine (FLEXERIL) 10 MG tablet Take 1 tablet (10 mg total) by mouth 2 (two) times daily as needed for muscle spasms. 11/08/20   Sabas Sous, MD  doxycycline (VIBRAMYCIN) 100 MG capsule Take 100 mg by mouth 2 (two) times daily. 04/29/23   [provider]  lidocaine (LIDODERM) 5 % Place 1 patch onto the skin daily. Remove & Discard patch within 12 hours or as directed by MD Patient not taking: Reported on 09/18/2022 11/08/20   Sabas Sous, MD  lisinopril-hydrochlorothiazide (ZESTORETIC) 10-12.5 MG tablet Take 1 tablet by mouth daily.    [provider]  metFORMIN (GLUCOPHAGE) 1000 MG tablet Take 1,000 mg by mouth 2 (two) times daily.     [provider]  metoprolol tartrate (LOPRESSOR) 25 MG tablet Take 1 tab 2 hours prior to CT 12/05/22   Mallipeddi, Vishnu P, MD  pravastatin (PRAVACHOL) 10 MG tablet Take 10 mg by mouth daily. 11/22/19   [provider]  sitaGLIPtin (JANUVIA) 100 MG  tablet Take 100 mg by mouth daily.     [provider]      Allergies    Patient has no known allergies.    Review of Systems   Review of Systems  Constitutional:  Positive for fatigue. Negative for chills and fever.  HENT:  Positive for congestion. Negative for ear pain and sore throat.   Eyes:  Negative for pain and visual disturbance.  Respiratory:  Positive for cough. Negative for shortness of breath.   Cardiovascular:  Negative for chest pain and palpitations.  Gastrointestinal:  Positive for diarrhea. Negative for abdominal pain and vomiting.  Genitourinary:  Negative for dysuria and hematuria.  Musculoskeletal:  Positive for myalgias. Negative for arthralgias and back pain.  Skin:  Negative for color change and rash.  Neurological:  Negative for seizures and syncope.  All other systems reviewed and are negative.   Physical Exam Updated Vital Signs BP (!) 157/71 (BP Location: Right Arm)   Pulse (!) 58   Temp 98.5 F (36.9 C) (Oral)   Resp 18   Ht 1.753 m (5\' 9" )   Wt 96.6 kg   SpO2 99%   BMI 31.45 kg/m  Physical Exam Vitals and nursing note reviewed.  Constitutional:      General: She is not in acute distress.    Appearance: Normal appearance. She is well-developed. She is not ill-appearing.  HENT:     Head: Normocephalic and atraumatic.  Eyes:     Conjunctiva/sclera: Conjunctivae normal.  Cardiovascular:     Rate and Rhythm: Normal rate and regular rhythm.     Heart sounds: No murmur heard. Pulmonary:     Effort: Pulmonary effort is normal.     Breath sounds: Normal breath sounds. No wheezing, rhonchi or rales.  Abdominal:     Palpations: Abdomen is soft.     Tenderness: There is no abdominal tenderness.  Musculoskeletal:        General: No swelling.     Cervical back: Neck supple.  Skin:    General: Skin is warm and dry.     Capillary Refill: Capillary refill takes less than 2 seconds.  Neurological:     General: No focal deficit present.      Mental Status: She is alert and oriented to person, place, and time.  Psychiatric:        Mood and Affect: Mood normal.     ED Results / Procedures / Treatments   Labs (all labs ordered are listed, but only abnormal results are displayed) Labs Reviewed  SARS CORONAVIRUS 2 BY RT PCR    EKG None  Radiology No results found.  Procedures Procedures    Medications Ordered in ED Medications - No data to display  ED Course/ Medical Decision Making/ A&P                                 Medical Decision Making  Patient's symptoms so certainly seem to be clinically consistent with COVID.  Will retest.  Patient without any hypoxia patient not in any acute distress.  Patient has been doing symptomatic treatment at home.  We will see what we get on our COVID testing.  Clinically sounded as if it was probably COVID and COVID test here is positive.  Patient be treated symptomatically precautions provided.  Work note provided.  Patient since symptoms started on Monday not really a candidate for Paxlovid at this point in time.  Patient nontoxic no acute distress not meeting any criteria for admission secondary to the COVID infection.   Final Clinical Impression(s) / ED Diagnoses Final diagnoses:  None    Rx / DC Orders ED Discharge Orders     None         Vanetta Mulders, MD 07/12/23 4098    Vanetta Mulders, MD 07/12/23 [phone]

## 2023-07-12 NOTE — Discharge Instructions (Addendum)
COVID test is positive.  Treat yourself symptomatically.  Continue to quarantine.  Work note provided to be out of work until Tuesday.  Return for any new or worse symptoms.  Return for trouble breathing.

## 2023-07-12 NOTE — ED Notes (Signed)
Patient Alert and oriented to baseline. Stable and ambulatory to baseline. Patient verbalized understanding of the discharge instructions.  Patient belongings were taken by the patient.   

## 2023-07-23 ENCOUNTER — Ambulatory Visit: Payer: PRIVATE HEALTH INSURANCE | Attending: Internal Medicine | Admitting: Internal Medicine

## 2023-07-23 ENCOUNTER — Encounter: Payer: Self-pay | Admitting: Internal Medicine

## 2023-07-23 VITALS — BP 142/78 | HR 70 | Ht 69.0 in | Wt 206.0 lb

## 2023-07-23 DIAGNOSIS — I10 Essential (primary) hypertension: Secondary | ICD-10-CM | POA: Diagnosis not present

## 2023-07-23 NOTE — Patient Instructions (Signed)
Medication Instructions:  Your physician recommends that you continue on your current medications as directed. Please refer to the Current Medication list given to you today.  *If you need a refill on your cardiac medications before your next appointment, please call your pharmacy*   Lab Work: None If you have labs (blood work) drawn today and your tests are completely normal, you will receive your results only by: MyChart Message (if you have MyChart) OR A paper copy in the mail If you have any lab test that is abnormal or we need to change your treatment, we will call you to review the results.   Testing/Procedures: None   Follow-Up: At [facility], you and your health needs are our priority.  As part of our continuing mission to provide you with exceptional heart care, we have created designated Provider Care Teams.  These Care Teams include your primary Cardiologist (physician) and Advanced Practice Providers (APPs -  Physician Assistants and Nurse Practitioners) who all work together to provide you with the care you need, when you need it.  We recommend signing up for the patient portal called "MyChart".  Sign up information is provided on this After Visit Summary.  MyChart is used to connect with patients for Virtual Visits (Telemedicine).  Patients are able to view lab/test results, encounter notes, upcoming appointments, etc.  Non-urgent messages can be sent to your provider as well.   To learn more about what you can do with MyChart, go to [email or web address].    Your next appointment:   Follow up as needed.    Provider:   Vishnu Mallipeddi, MD    Other Instructions    

## 2023-07-23 NOTE — Progress Notes (Signed)
Cardiology Office Note  Date: 07/23/2023   ID: [name], DOB July 19, 1956, MRN [id number]  PCP:  Alvina Filbert, MD  Cardiologist:  Marjo Bicker, MD Electrophysiologist:  None    History of Present Illness: [name] is a 67 y.o. female known to have HTN, DM 2, nicotine abuse, GERD is here for follow-up visit.  Patient was referred to cardiology clinic for chest pain, CT cardiac in 5/24 showed coronary calcium score of 66, minimal CAD.  Echo from 2/24 showed normal LVEF and no valvular heart disease.  Bilateral ABI and TBI within normal limits. She is here for follow-up visit.  Overall doing great, hardly has any chest pains.  No DOE, orthopnea, PND, palpitations, dizziness, syncope.  No .  Continues to smoke cigarettes 1-1 and half pack per day.     Past Medical History:  Diagnosis Date   Diabetes mellitus    GERD (gastroesophageal reflux disease)    Hypercholesterolemia    Hypertension     Past Surgical History:  Procedure Laterality Date   CESAREAN SECTION  1989   [place]   COLONOSCOPY  07/24/2012   Procedure: COLONOSCOPY;  Surgeon: Dalia Heading, MD;  Location[facility];  Service: Gastroenterology;  Laterality: N/A;   KNEE ARTHROSCOPY     left    Current Outpatient Medications  Medication Sig Dispense Refill   aspirin EC 81 MG tablet Take 81 mg by mouth daily.     cyclobenzaprine (FLEXERIL) 10 MG tablet Take 1 tablet (10 mg total) by mouth 2 (two) times daily as needed for muscle spasms. 20 tablet 0   lisinopril-hydrochlorothiazide (ZESTORETIC) 10-12.5 MG tablet Take 1 tablet by mouth daily.     metFORMIN (GLUCOPHAGE) 1000 MG tablet Take 1,000 mg by mouth 2 (two) times daily.      pravastatin (PRAVACHOL) 10 MG tablet Take 10 mg by mouth daily.     sitaGLIPtin (JANUVIA) 100 MG tablet Take 100 mg by mouth daily.      doxycycline (VIBRAMYCIN) 100 MG capsule Take 100 mg by mouth 2 (two) times daily. (Patient not taking: Reported on 07/23/2023)      lidocaine (LIDODERM) 5 % Place 1 patch onto the skin daily. Remove & Discard patch within 12 hours or as directed by MD (Patient not taking: Reported on 09/18/2022) 5 patch 0   No current facility-administered medications for this visit.   Allergies:  Patient has no known allergies.   Social History: The patient  reports that she has been smoking cigarettes. She started smoking about 49 years ago. She has a 49.6 pack-year smoking history. She has never used smokeless tobacco. She reports that she does not drink alcohol and does not use drugs.   Family History: The patient's family history includes Diabetes in her mother and sister; Heart attack in her sister; Hypertension in her mother and sister.   ROS:  Please see the history of present illness. Otherwise, complete review of systems is positive for none.  All other systems are reviewed and negative.   Physical Exam: VS:  Ht 5\' 9"  (1.753 m)   Wt 206 lb (93.4 kg)   BMI 30.42 kg/m , BMI Body mass index is 30.42 kg/m.  Wt Readings from Last 3 Encounters:  07/23/23 206 lb (93.4 kg)  07/12/23 213 lb (96.6 kg)  05/08/23 207 lb 12.8 oz (94.3 kg)    General: Patient appears comfortable at rest. HEENT: Conjunctiva and lids normal, oropharynx clear with moist mucosa. Neck: Supple,  no elevated JVP or carotid bruits, no thyromegaly. Lungs: Clear to auscultation, nonlabored breathing at rest. Cardiac: Regular rate and rhythm, no S3 or significant systolic murmur, no pericardial rub. Abdomen: Soft, nontender, no hepatomegaly, bowel sounds present, no guarding or rebound. Extremities: No pitting edema, distal pulses 2+. Skin: Warm and dry. Musculoskeletal: No kyphosis. Neuropsychiatric: Alert and oriented x3, affect grossly appropriate.  ECG: Normal sinus rhythm and no ST-T changes  Recent Labwork: 04/21/2023: Creatinine, Ser 0.80     Component Value Date/Time   CHOL 224 (H) 01/29/2018 1253   TRIG 141 01/29/2018 1253   HDL 42 (L)  01/29/2018 1253   CHOLHDL 5.3 (H) 01/29/2018 1253   VLDL 22 03/04/2009 0130   LDLCALC 155 (H) 01/29/2018 1253    Assessment and Plan: Patient is a 67 year old F known to have HTN, DM 2, nicotine abuse, GERD was referred to cardiology clinic for evaluation of chest pain.   # Non-cardiac chest pain -Coronary calcium score is 66.1 (78th percentile for age and sex matched control). Minimal CAD present.  Echo from 2/24 showed normal LVEF and no valvular heart disease. Continue aspirin 81 mg once daily and pravastatin 10 mg nightly.  # HTN, controlled -Continue lisinopril-HCTZ 10-12.5 mg once daily.  Follows with PCP.  # OSA screening -Refer to sleep medicine for home sleep study  # Nicotine abuse -Currently smokes 1-1 and half pack per day, smoking cessation counseling provided.  Follow up with PCP for smoking cessation.    Medication Adjustments/Labs and Tests Ordered: Current medicines are reviewed at length with the patient today.  Concerns regarding medicines are outlined above.   Tests Ordered: Orders Placed This Encounter  Procedures   EKG 12-Lead    Medication Changes: No orders of the defined types were placed in this encounter.   Disposition:  Follow up PRN  Signed, Casondra Gasca Verne Spurr, MD, 07/23/2023 8:59 AM    [facility] at [facility] [address], [place], [place] [address]

## 2023-07-31 ENCOUNTER — Ambulatory Visit (HOSPITAL_COMMUNITY)
Admission: RE | Admit: 2023-07-31 | Discharge: 2023-07-31 | Disposition: A | Discharge status: Home / Self Care | Payer: PRIVATE HEALTH INSURANCE | Source: Ambulatory Visit | Attending: Nurse Practitioner | Admitting: Nurse Practitioner

## 2023-07-31 ENCOUNTER — Other Ambulatory Visit (HOSPITAL_COMMUNITY): Payer: Self-pay | Admitting: Nurse Practitioner

## 2023-07-31 ENCOUNTER — Encounter (HOSPITAL_COMMUNITY): Payer: Self-pay | Admitting: Nurse Practitioner

## 2023-07-31 DIAGNOSIS — N939 Abnormal uterine and vaginal bleeding, unspecified: Secondary | ICD-10-CM | POA: Insufficient documentation

## 2023-08-07 ENCOUNTER — Ambulatory Visit (INDEPENDENT_AMBULATORY_CARE_PROVIDER_SITE_OTHER): Payer: PRIVATE HEALTH INSURANCE | Admitting: Obstetrics & Gynecology

## 2023-08-07 ENCOUNTER — Other Ambulatory Visit (HOSPITAL_COMMUNITY)
Admission: RE | Admit: 2023-08-07 | Discharge: 2023-08-07 | Disposition: A | Discharge status: Home / Self Care | Payer: PRIVATE HEALTH INSURANCE | Source: Ambulatory Visit | Attending: Obstetrics & Gynecology | Admitting: Obstetrics & Gynecology

## 2023-08-07 ENCOUNTER — Encounter: Payer: Self-pay | Admitting: Obstetrics & Gynecology

## 2023-08-07 VITALS — BP 139/88 | HR 84 | Ht 68.0 in | Wt 205.4 lb

## 2023-08-07 DIAGNOSIS — N898 Other specified noninflammatory disorders of vagina: Secondary | ICD-10-CM | POA: Insufficient documentation

## 2023-08-07 DIAGNOSIS — N950 Postmenopausal bleeding: Secondary | ICD-10-CM | POA: Diagnosis not present

## 2023-08-07 DIAGNOSIS — R9389 Abnormal findings on diagnostic imaging of other specified body structures: Secondary | ICD-10-CM | POA: Insufficient documentation

## 2023-08-07 NOTE — Progress Notes (Signed)
GYN VISIT Patient name: [name] MRN [id number]  Date of birth: 08-18-1956 Chief Complaint:   Procedure  History of Present Illness:   [name] is a 67 y.o. G3P0 PM female being seen today for follow up regarding the following:  PMB: Last Wed. Started noted considerable pelvic pain.  Thought gas pain.  Then went to the bathroom and when she wiped noting pink spotting.  The bleeding also continued and was now BRB.  Bleeding lasted for less than a day.  Now back to just pink spotting.  Still noting intermittent pelvic pain as well.  Of note, when pt was previously seen for colposcopy, there was questionable PMB.  She initially had postponed [place] since bleeding had improved; however, when the bleeding returned she followed up with PCP.  [place] was completed that showed:  07/31/2023 Pelvic US: 6.4 x 4.7 x 2.8 cm = volume: 44 mL. No fibroids or other mass visualized. 9mm endometrium.  Ovaries not visualized.  She is also noting vaginal itching and irritation. Denies discharge or odor.   No LMP recorded. Patient is postmenopausal.    Review of Systems:   Pertinent items are noted in HPI Denies fever/chills, dizziness, headaches, visual disturbances, fatigue, shortness of breath, chest pain, abdominal pain, vomiting.  Past Surgical History:  Procedure Laterality Date   CESAREAN SECTION  1989   [place]   COLONOSCOPY  07/24/2012   Procedure: COLONOSCOPY;  Surgeon: Dalia Heading, MD;  Location: [facility];  Service: Gastroenterology;  Laterality: N/A;   KNEE ARTHROSCOPY     left    Past Medical History:  Diagnosis Date   Diabetes mellitus    GERD (gastroesophageal reflux disease)    Hypercholesterolemia    Hypertension    Reviewed problem list, medications and allergies. Physical Assessment:   Vitals:   08/07/23 1445  BP: 139/88  Pulse: 84  Weight: 205 lb 6.4 oz (93.2 kg)  Height: 5\' 8"  (1.727 m)  Body mass index is 31.23 kg/m.       Physical Examination:    General appearance: alert, well appearing, and in no distress  Psych: mood appropriate, normal affect  Skin: warm & dry   Cardiovascular: normal heart rate noted  Respiratory: normal respiratory effort, no distress  Abdomen: soft, non-tender, no reproducible pain  Pelvic: VULVA: normal appearing vulva with no masses, tenderness or lesions, VAGINA: normal appearing vagina with normal color and discharge, no lesions, CERVIX: normal appearing cervix without discharge or lesions  Extremities: no edema   Chaperone: Faith Rogue    Endometrial Biopsy Procedure Note  Pre-operative Diagnosis: postmenopausal bleeding  Post-operative Diagnosis: same  Procedure Details   The risks (including infection, bleeding, pain, and uterine perforation) and benefits of the procedure were explained to the patient and Written informed consent was obtained.  Antibiotic prophylaxis against endocarditis was not indicated.   The patient was placed in the dorsal lithotomy position.  Bimanual exam showed the uterus to be in the neutral position.  A speculum inserted in the vagina, and the cervix prepped with betadine.     A single tooth tenaculum was applied to the anterior lip of the cervix for stabilization.  Os finder was used.  A Pipelle endometrial aspirator was used to sample the endometrium.  Sample was sent for pathologic examination.  Condition: Stable  Complications: None   Assessment & Plan:  1) PMB -reviewed potential etiology ranging from atrophy to endometrial cancer -Reviewed recent ultrasound that showed thickened lining and  discussed recommendation for EMB -Procedure completed as above -Next step pending results of pathology.   The patient was advised to call for any fever or for prolonged or severe pain or bleeding. She was advised to use OTC analgesics as needed for mild to moderate pain. She was advised to avoid vaginal intercourse for 48 hours or until the bleeding has completely  stopped.  2) Vaginal itching -vaginitis panel obtained, further management pending results  Return for TBD.   Myna Hidalgo, DO Attending Obstetrician & Gynecologist, [facility] for [employer], [facility]

## 2023-08-14 ENCOUNTER — Telehealth: Payer: Self-pay | Admitting: Obstetrics & Gynecology

## 2023-08-14 DIAGNOSIS — N760 Acute vaginitis: Secondary | ICD-10-CM

## 2023-08-14 LAB — CERVICOVAGINAL ANCILLARY ONLY
Bacterial Vaginitis (gardnerella): POSITIVE — AB
Candida Glabrata: NEGATIVE
Candida Vaginitis: NEGATIVE
Comment: NEGATIVE
Comment: NEGATIVE
Comment: NEGATIVE

## 2023-08-14 LAB — SURGICAL PATHOLOGY

## 2023-08-14 MED ORDER — METRONIDAZOLE 500 MG PO TABS
500.0000 mg | ORAL_TABLET | Freq: Two times a day (BID) | ORAL | 0 refills | Status: AC
Start: 2023-08-14 — End: 2023-08-21

## 2023-08-14 NOTE — Telephone Encounter (Signed)
Tried call patient- not home until after 4pm Tried calling back around 5pm- phone line busy  Please schedule pt for appointment to discuss results of recent EMB and next step. Additionally Rx for BV treatment sent in

## 2023-08-15 ENCOUNTER — Telehealth: Payer: Self-pay | Admitting: Obstetrics & Gynecology

## 2023-08-15 NOTE — Telephone Encounter (Signed)
Called pt with results -Discussed BV infection and prescription that was sent in -Reviewed endometrial biopsy which showed a polyp -Reassured patient that this is a benign finding however it is likely the cause of her bleeding -Discussed plan to proceed with hysteroscopy D&C, polypectomy -Reviewed risk benefits and indication including risk of bleeding, infection and injury to such as uterine perforation require further surgical intervention -Questions and concerns were addressed and patient desires to proceed  Myna Hidalgo, DO Attending Obstetrician & Gynecologist, [facility] for [employer], [facility]

## 2023-08-18 ENCOUNTER — Encounter: Payer: Self-pay | Admitting: Obstetrics & Gynecology

## 2023-09-03 NOTE — Patient Instructions (Addendum)
Your procedure is scheduled on: 09/09/2023  Report to [facility] Main Entrance at    11:15 AM.  Call this number if you have problems the morning of surgery: [phone]   Remember:   Do not Eat or Drink after midnight         No Smoking the morning of surgery  :  Take these medicines the morning of surgery with A SIP OF WATER: none  Hold Januvia for 24 hours prior to surgery  last dose 09/07/2023  No diabtetic medication am of procedure   Do not wear jewelry, make-up or nail polish.  Do not wear lotions, powders, or perfumes. You may wear deodorant.  Do not shave 48 hours prior to surgery. Men may shave face and neck.  Do not bring valuables to the hospital.  Contacts, dentures or bridgework may not be worn into surgery.  Leave suitcase in the car. After surgery it may be brought to your room.  For patients admitted to the hospital, checkout time is 11:00 AM the day of discharge.   Patients discharged the day of surgery will not be allowed to drive home.    Special Instructions: Shower using CHG night before surgery and shower the day of surgery use CHG.  Use special wash - you have one bottle of CHG for all showers.  You should use approximately 1/2 of the bottle for each shower. How to Use Chlorhexidine Before Surgery Chlorhexidine gluconate (CHG) is a germ-killing (antiseptic) solution that is used to clean the skin. It can get rid of the bacteria that normally live on the skin and can keep them away for about 24 hours. To clean your skin with CHG, you may be given: A CHG solution to use in the shower or as part of a sponge bath. A prepackaged cloth that contains CHG. Cleaning your skin with CHG may help lower the risk for infection: While you are staying in the intensive care unit of the hospital. If you have a vascular access, such as a central line, to provide short-term or long-term access to your veins. If you have a catheter to drain urine from your bladder. If you are  on a ventilator. A ventilator is a machine that helps you breathe by moving air in and out of your lungs. After surgery. What are the risks? Risks of using CHG include: A skin reaction. Hearing loss, if CHG gets in your ears and you have a perforated eardrum. Eye injury, if CHG gets in your eyes and is not rinsed out. The CHG product catching fire. Make sure that you avoid smoking and flames after applying CHG to your skin. Do not use CHG: If you have a chlorhexidine allergy or have previously reacted to chlorhexidine. On babies younger than 81 months of age. How to use CHG solution Use CHG only as told by your health care provider, and follow the instructions on the label. Use the full amount of CHG as directed. Usually, this is one bottle. During a shower Follow these steps when using CHG solution during a shower (unless your health care provider gives you different instructions): Start the shower. Use your normal soap and shampoo to wash your face and hair. Turn off the shower or move out of the shower stream. Pour the CHG onto a clean washcloth. Do not use any type of brush or rough-edged sponge. Starting at your neck, lather your body down to your toes. Make sure you follow these instructions: If you will  be having surgery, pay special attention to the part of your body where you will be having surgery. Scrub this area for at least 1 minute. Do not use CHG on your head or face. If the solution gets into your ears or eyes, rinse them well with water. Avoid your genital area. Avoid any areas of skin that have broken skin, cuts, or scrapes. Scrub your back and under your arms. Make sure to wash skin folds. Let the lather sit on your skin for 1-2 minutes or as long as told by your health care provider. Thoroughly rinse your entire body in the shower. Make sure that all body creases and crevices are rinsed well. Dry off with a clean towel. Do not put any substances on your body  afterward--such as powder, lotion, or perfume--unless you are told to do so by your health care provider. Only use lotions that are recommended by the manufacturer. Put on clean clothes or pajamas. If it is the night before your surgery, sleep in clean sheets.  During a sponge bath Follow these steps when using CHG solution during a sponge bath (unless your health care provider gives you different instructions): Use your normal soap and shampoo to wash your face and hair. Pour the CHG onto a clean washcloth. Starting at your neck, lather your body down to your toes. Make sure you follow these instructions: If you will be having surgery, pay special attention to the part of your body where you will be having surgery. Scrub this area for at least 1 minute. Do not use CHG on your head or face. If the solution gets into your ears or eyes, rinse them well with water. Avoid your genital area. Avoid any areas of skin that have broken skin, cuts, or scrapes. Scrub your back and under your arms. Make sure to wash skin folds. Let the lather sit on your skin for 1-2 minutes or as long as told by your health care provider. Using a different clean, wet washcloth, thoroughly rinse your entire body. Make sure that all body creases and crevices are rinsed well. Dry off with a clean towel. Do not put any substances on your body afterward--such as powder, lotion, or perfume--unless you are told to do so by your health care provider. Only use lotions that are recommended by the manufacturer. Put on clean clothes or pajamas. If it is the night before your surgery, sleep in clean sheets. How to use CHG prepackaged cloths Only use CHG cloths as told by your health care provider, and follow the instructions on the label. Use the CHG cloth on clean, dry skin. Do not use the CHG cloth on your head or face unless your health care provider tells you to. When washing with the CHG cloth: Avoid your genital area. Avoid  any areas of skin that have broken skin, cuts, or scrapes. Before surgery Follow these steps when using a CHG cloth to clean before surgery (unless your health care provider gives you different instructions): Using the CHG cloth, vigorously scrub the part of your body where you will be having surgery. Scrub using a back-and-forth motion for 3 minutes. The area on your body should be completely wet with CHG when you are done scrubbing. Do not rinse. Discard the cloth and let the area air-dry. Do not put any substances on the area afterward, such as powder, lotion, or perfume. Put on clean clothes or pajamas. If it is the night before your surgery, sleep in clean sheets.  For general bathing Follow these steps when using CHG cloths for general bathing (unless your health care provider gives you different instructions). Use a separate CHG cloth for each area of your body. Make sure you wash between any folds of skin and between your fingers and toes. Wash your body in the following order, switching to a new cloth after each step: The front of your neck, shoulders, and chest. Both of your arms, under your arms, and your hands. Your stomach and groin area, avoiding the genitals. Your right leg and foot. Your left leg and foot. The back of your neck, your back, and your buttocks. Do not rinse. Discard the cloth and let the area air-dry. Do not put any substances on your body afterward--such as powder, lotion, or perfume--unless you are told to do so by your health care provider. Only use lotions that are recommended by the manufacturer. Put on clean clothes or pajamas. Contact a health care provider if: Your skin gets irritated after scrubbing. You have questions about using your solution or cloth. You swallow any chlorhexidine. Call your local poison control center ([phone] in the [place] Get help right away if: Your eyes itch badly, or they become very red or swollen. Your skin itches  badly and is red or swollen. Your hearing changes. You have trouble seeing. You have swelling or tingling in your mouth or throat. You have trouble breathing. These symptoms may represent a serious problem that is an emergency. Do not wait to see if the symptoms will go away. Get medical help right away. Call your local emergency services (911 in the [place] Do not drive yourself to the hospital. Summary Chlorhexidine gluconate (CHG) is a germ-killing (antiseptic) solution that is used to clean the skin. Cleaning your skin with CHG may help to lower your risk for infection. You may be given CHG to use for bathing. It may be in a bottle or in a prepackaged cloth to use on your skin. Carefully follow your health care provider's instructions and the instructions on the product label. Do not use CHG if you have a chlorhexidine allergy. Contact your health care provider if your skin gets irritated after scrubbing. This information is not intended to replace advice given to you by your health care provider. Make sure you discuss any questions you have with your health care provider. Document Revised: 03/25/2022 Document Reviewed: 02/05/2021 Elsevier Patient Education  2023 [employer] Dilation and Curettage, Care After The following information offers guidance on how to care for yourself after your procedure. Your doctor may also give you more specific instructions. If you have problems or questions, contact your doctor. What can I expect after the procedure? After the procedure, it is common to have: Mild pain or cramps. Some bleeding or spotting from the vagina. These may last for up to 2 weeks. Follow these instructions at home: Medicines Take over-the-counter and prescription medicines only as told by your doctor. If told, take steps to prevent problems with pooping (constipation). You may need to: Drink enough fluid to keep your pee (urine) pale yellow. Take medicines. You will be told  what medicines to take. Eat foods that are high in fiber. These include beans, whole grains, and fresh fruits and vegetables. Limit foods that are high in fat and sugar. These include fried or sweet foods. Ask your doctor if you should avoid driving or using machines while you are taking your medicine. Activity  If you were given a medicine to help you  relax (sedative) during your procedure, it can affect you for many hours. Do not drive or use machinery until your doctor says that it is safe. Rest as told by your doctor. Get up to take short walks every 1-2 hours. Ask for help if you feel weak or unsteady. Do not lift anything that is heavier than 10 lb (4.5 kg), or the limit that you are told. Return to your normal activities when your doctor says that it is safe. Lifestyle For at least 2 weeks, or as long as told by your doctor: Do not douche. Do not use tampons. Do not have sex. General instructions Do not take baths, swim, or use a hot tub. Ask your doctor if you may take showers. Do not smoke or use any products that contain nicotine or tobacco. These can delay healing. If you need help quitting, ask your doctor. Wear compression stockings as told by your doctor. It is up to you to get the results of your procedure. Ask how to get your results when they are ready. Keep all follow-up visits. Contact a doctor if: You have very bad cramps that get worse or do not get better with medicine. You have very bad pain in your belly (abdomen). You cannot drink fluids without vomiting. You have pain in the area just above your thighs. You have fluid from your vagina that smells bad. You have a rash. Get help right away if: You are bleeding a lot from your vagina. This means soaking more than one sanitary pad in 1 hour, and this happens for 2 hours in a row. You have a fever that is above 100.41F (38C). Your belly feels very tender or hard. You have chest pain. You have trouble  breathing. You feel dizzy or light-headed. You faint. You have pain in your neck or shoulder area. These symptoms may be an emergency. Get help right away. Call your local emergency services (911 in the [place] Do not wait to see if the symptoms will go away. Do not drive yourself to the hospital. Summary After your procedure, it is common to have pain or cramping. It is also common to have bleeding or spotting from your vagina. Rest as told. Get up to take short walks every 1-2 hours. Do not lift anything that is heavier than 10 lb (4.5 kg), or the limit that you are told. Get help right away if you have problems from the procedure. Ask your doctor what problems to watch for. This information is not intended to replace advice given to you by your health care provider. Make sure you discuss any questions you have with your health care provider. Document Revised: 11/15/2020 Document Reviewed: 11/15/2020 Elsevier Patient Education  2024 [employer]  Hysteroscopy Hysteroscopy is a procedure used to look inside a woman's womb (uterus). This may be done for various reasons, including: To look for tumors and other growths in the uterus. To evaluate abnormal bleeding, fibroid tumors, polyps, scar tissue, or uterine cancer. To determine why a woman is unable to get pregnant or has had repeated pregnancy losses (miscarriages). To locate an intrauterine device (IUD). To place a birth control device into the fallopian tubes. During this procedure, a thin, flexible tube with a small light and camera (hysteroscope) is used to examine the uterus. The camera sends images to a monitor in the room so that your health care provider can view the inside of your uterus. A hysteroscopy should be done right after a menstrual period. Tell  a health care provider about: Any allergies you have. All medicines you are taking, including vitamins, herbs, eye drops, creams, and over-the-counter medicines. Any problems  you or family members have had with anesthetic medicines. Any bleeding problems you have. Any surgeries you have had. Any medical conditions you have. Whether you are pregnant or may be pregnant. Whether you have been diagnosed with an sexually transmitted infection (STI) or you think you have an STI. What are the risks? Your health care provider will talk with you about risks. These may include: Excessive bleeding. Infection. Damage to the uterus or other structures or organs. Allergic reaction to medicines or fluids that are used in the procedure. What happens before the procedure? When to stop eating and drinking Follow instructions from your health care provider about what you may eat and drink. These may include: 8 hours before your procedure Stop eating most foods. Do not eat meat, fried foods, or fatty foods. Eat only light foods, such as toast or crackers. All liquids are okay except energy drinks and alcohol. 6 hours before your procedure Stop eating. Drink only clear liquids, such as water, clear fruit juice, black coffee, plain tea, and sports drinks. Do not drink energy drinks or alcohol. 2 hours before your procedure Stop drinking all liquids. You may be allowed to take medicines with small sips of water. If you do not follow your health care provider's instructions, your procedure may be delayed or canceled. Medicines Ask your health care provider about: Changing or stopping your regular medicines. These include any diabetes medicines or blood thinners you take. Taking medicines such as aspirin and ibuprofen. These medicines can thin your blood. Do not take them unless your health care provider tells you to. Taking over-the-counter medicines, vitamins, herbs, and supplements. Medicine may be placed in your cervix the day before the procedure. This medicine causes the cervix to open (dilate). The larger opening makes it easier for the hysteroscope to be inserted into the  uterus during the procedure. General instructions Ask your health care provider: What steps will be taken to help prevent infection. These steps may include: Washing skin with a soap that kills germs. Taking antibiotic medicine. Do not use any products that contain nicotine or tobacco for at least 4 weeks before the procedure. These products include cigarettes, chewing tobacco, and vaping devices, such as e-cigarettes. If you need help quitting, ask your health care provider. If you will be going home right after the procedure, plan to have a responsible adult: Take you home from the hospital or clinic. You will not be allowed to drive. Care for you for the time you are told. Empty your bladder before the procedure begins. What happens during the procedure? An IV will be inserted into one of your veins. You may be given: A sedative. This helps you relax. Anesthesia. This will: Numb certain areas of your body. Make you fall asleep for surgery. A hysteroscope will be inserted through your vagina and into your uterus. Air or fluid will be used to enlarge your uterus to allow your health care provider to see it better. The amount of fluid used will be carefully checked throughout the procedure. In some cases, tissue may be gently scraped from inside the uterus and sent to a lab for testing (biopsy). The procedure may vary among health care providers and hospitals. What happens after the procedure? Your blood pressure, heart rate, breathing rate, and blood oxygen level will be monitored until you leave the hospital  or clinic. You may have cramps. You may be given medicines for this. You may have bleeding, which may vary from light spotting to menstrual-like bleeding. This is normal. If you had a biopsy, it is up to you to get the results. Ask your health care provider, or the department that is doing the procedure, when your results will be ready. Summary Hysteroscopy is a procedure that is  used to look inside a woman's womb (uterus). After the procedure, you may have bleeding, which varies from light spotting to menstrual-like bleeding. This is normal. You may also have cramps. Plan to have a responsible adult take you home from the hospital or clinic. If you had a biopsy, it is up to you to get the results. Ask your health care provider, or the department that is doing the procedure, when your results will be ready. This information is not intended to replace advice given to you by your health care provider. Make sure you discuss any questions you have with your health care provider. Document Revised: 03/11/2022 Document Reviewed: 03/11/2022 Elsevier Patient Education  2024 [employer]. General Anesthesia, Adult, Care After The following information offers guidance on how to care for yourself after your procedure. Your health care provider may also give you more specific instructions. If you have problems or questions, contact your health care provider. What can I expect after the procedure? After the procedure, it is common for people to: Have pain or discomfort at the IV site. Have nausea or vomiting. Have a sore throat or hoarseness. Have trouble concentrating. Feel cold or chills. Feel weak, sleepy, or tired (fatigue). Have soreness and body aches. These can affect parts of the body that were not involved in surgery. Follow these instructions at home: For the time period you were told by your health care provider:  Rest. Do not participate in activities where you could fall or become injured. Do not drive or use machinery. Do not drink alcohol. Do not take sleeping pills or medicines that cause drowsiness. Do not make important decisions or sign legal documents. Do not take care of children on your own. General instructions Drink enough fluid to keep your urine pale yellow. If you have sleep apnea, surgery and certain medicines can increase your risk for breathing  problems. Follow instructions from your health care provider about wearing your sleep device: Anytime you are sleeping, including during daytime naps. While taking prescription pain medicines, sleeping medicines, or medicines that make you drowsy. Return to your normal activities as told by your health care provider. Ask your health care provider what activities are safe for you. Take over-the-counter and prescription medicines only as told by your health care provider. Do not use any products that contain nicotine or tobacco. These products include cigarettes, chewing tobacco, and vaping devices, such as e-cigarettes. These can delay incision healing after surgery. If you need help quitting, ask your health care provider. Contact a health care provider if: You have nausea or vomiting that does not get better with medicine. You vomit every time you eat or drink. You have pain that does not get better with medicine. You cannot urinate or have bloody urine. You develop a skin rash. You have a fever. Get help right away if: You have trouble breathing. You have chest pain. You vomit blood. These symptoms may be an emergency. Get help right away. Call 911. Do not wait to see if the symptoms will go away. Do not drive yourself to the hospital. Summary  After the procedure, it is common to have a sore throat, hoarseness, nausea, vomiting, or to feel weak, sleepy, or fatigue. For the time period you were told by your health care provider, do not drive or use machinery. Get help right away if you have difficulty breathing, have chest pain, or vomit blood. These symptoms may be an emergency. This information is not intended to replace advice given to you by your health care provider. Make sure you discuss any questions you have with your health care provider. Document Revised: 02/22/2022 Document Reviewed: 02/22/2022 Elsevier Patient Education  2024 [employer].

## 2023-09-05 ENCOUNTER — Encounter (HOSPITAL_COMMUNITY): Payer: Self-pay

## 2023-09-05 ENCOUNTER — Encounter (HOSPITAL_COMMUNITY)
Admission: RE | Admit: 2023-09-05 | Discharge: 2023-09-05 | Disposition: A | Discharge status: Home / Self Care | Payer: PRIVATE HEALTH INSURANCE | Source: Ambulatory Visit | Attending: Obstetrics & Gynecology | Admitting: Obstetrics & Gynecology

## 2023-09-05 ENCOUNTER — Other Ambulatory Visit: Payer: Self-pay

## 2023-09-05 VITALS — BP 146/74 | HR 64 | Temp 97.8°F | Resp 18 | Ht 68.0 in | Wt 205.5 lb

## 2023-09-05 DIAGNOSIS — Z01812 Encounter for preprocedural laboratory examination: Secondary | ICD-10-CM | POA: Diagnosis not present

## 2023-09-05 DIAGNOSIS — N950 Postmenopausal bleeding: Secondary | ICD-10-CM | POA: Diagnosis not present

## 2023-09-05 DIAGNOSIS — E1165 Type 2 diabetes mellitus with hyperglycemia: Secondary | ICD-10-CM | POA: Insufficient documentation

## 2023-09-05 DIAGNOSIS — Z01818 Encounter for other preprocedural examination: Secondary | ICD-10-CM | POA: Diagnosis present

## 2023-09-05 LAB — HEMOGLOBIN A1C
Hgb A1c MFr Bld: 7.3 % — ABNORMAL HIGH (ref 4.8–5.6)
Mean Plasma Glucose: 162.81 mg/dL

## 2023-09-05 LAB — CBC
HCT: 40.3 % (ref 36.0–46.0)
Hemoglobin: 12.8 g/dL (ref 12.0–15.0)
MCH: 24.4 pg — ABNORMAL LOW (ref 26.0–34.0)
MCHC: 31.8 g/dL (ref 30.0–36.0)
MCV: 76.8 fL — ABNORMAL LOW (ref 80.0–100.0)
Platelets: 319 10*3/uL (ref 150–400)
RBC: 5.25 MIL/uL — ABNORMAL HIGH (ref 3.87–5.11)
RDW: 16.6 % — ABNORMAL HIGH (ref 11.5–15.5)
WBC: 5.2 10*3/uL (ref 4.0–10.5)
nRBC: 0 % (ref 0.0–0.2)

## 2023-09-05 LAB — COMPREHENSIVE METABOLIC PANEL
ALT: 13 U/L (ref 0–44)
AST: 17 U/L (ref 15–41)
Albumin: 3.6 g/dL (ref 3.5–5.0)
Alkaline Phosphatase: 56 U/L (ref 38–126)
Anion gap: 11 (ref 5–15)
BUN: 8 mg/dL (ref 8–23)
CO2: 24 mmol/L (ref 22–32)
Calcium: 9 mg/dL (ref 8.9–10.3)
Chloride: 100 mmol/L (ref 98–111)
Creatinine, Ser: 0.76 mg/dL (ref 0.44–1.00)
GFR, Estimated: >60 mL/min (ref >60)
Glucose, Bld: 116 mg/dL — ABNORMAL HIGH (ref 70–99)
Potassium: 3.6 mmol/L (ref 3.5–5.1)
Sodium: 135 mmol/L (ref 135–145)
Total Bilirubin: 0.4 mg/dL (ref 0.3–1.2)
Total Protein: 7.2 g/dL (ref 6.5–8.1)

## 2023-09-05 NOTE — Progress Notes (Signed)
PAT visit completed. Pt verbalized understanding on procedure instructions and arrival time.   

## 2023-09-08 NOTE — H&P (Signed)
[facility] Attending History and Physical  [name] is a 67 y.o. postmenopausal female who presents for scheduled hysteroscopy, D&C, possible myosure polypectomy due to PMB and endometrial polyp  In review, pt was seen end of August where noted irregular pink spotting to occasional episodes of bright red blood.  [place] was completed that showed a thickened lining and follow up EMB was completed- path with benign endometrial polyp.  She presents today for surgical intervention of polyp.   She reports no acute complaints or changes since her prior visit.  Denies any abnormal vaginal discharge, fevers, chills, sweats, dysuria, nausea, vomiting, other GI or GU symptoms or other general symptoms.  Past Medical History:  Diagnosis Date   Diabetes mellitus    GERD (gastroesophageal reflux disease)    Hypercholesterolemia    Hypertension    Past Surgical History:  Procedure Laterality Date   [place]AREAN SECTION  1989   [place]   COLONOSCOPY  07/24/2012   Procedure: COLONOSCOPY;  Surgeon: Dalia Heading, MD;  Location: [facility];  Service: Gastroenterology;  Laterality: N/A;   KNEE ARTHROSCOPY     left   OB History  Gravida Para Term Preterm AB Living  3         3  SAB IAB Ectopic Multiple Live Births               # Outcome Date GA Lbr Len/2nd Weight Sex Type Anes PTL Lv  3 Gravida           2 Gravida           1 Gravida           Patient denies any other pertinent gynecologic issues.  No current facility-administered medications on file prior to encounter.   Current Outpatient Medications on File Prior to Encounter  Medication Sig Dispense Refill   albuterol (VENTOLIN HFA) 108 (90 Base) MCG/ACT inhaler Inhale 2 puffs into the lungs every 6 (six) hours as needed for wheezing (cough).     aspirin EC 81 MG tablet Take 81 mg by mouth daily.     Cranberry 500 MG TABS Take 500 mg by mouth daily.     lisinopril-hydrochlorothiazide (ZESTORETIC) 20-25 MG  tablet Take 1 tablet by mouth daily.     metFORMIN (GLUCOPHAGE) 1000 MG tablet Take 1,000 mg by mouth 2 (two) times daily.      pravastatin (PRAVACHOL) 10 MG tablet Take 10 mg by mouth daily.     sitaGLIPtin (JANUVIA) 100 MG tablet Take 100 mg by mouth daily.      No Known Allergies  Social History:   reports that she has been smoking cigarettes. She started smoking about 49 years ago. She has a 49.7 pack-year smoking history. She has never used smokeless tobacco. She reports that she does not drink alcohol and does not use drugs. Family History  Problem Relation Age of Onset   Diabetes Mother    Hypertension Mother    Diabetes Sister    Hypertension Sister    Heart attack Sister     Review of Systems: Pertinent items noted in HPI and remainder of comprehensive ROS otherwise negative.  PHYSICAL EXAM: Blood pressure (!) 142/79, pulse 61, temperature 97.8 F (36.6 C), temperature source Oral, resp. rate 16, height 5\' 8"  (1.727 m), weight 93.2 kg, SpO2 100%. CONSTITUTIONAL: Well-developed, well-nourished female in no acute distress.  SKIN: Skin is warm and dry. No rash noted. Not diaphoretic. No erythema. No pallor.  NEUROLOGIC: Alert and oriented to person, place, and time. Normal reflexes, muscle tone coordination. No cranial nerve deficit noted. PSYCHIATRIC: Normal mood and affect. Normal behavior. Normal judgment and thought content. CARDIOVASCULAR: Normal heart rate noted, regular rhythm RESPIRATORY: Effort and breath sounds normal, no problems with respiration noted ABDOMEN: Soft, nontender, nondistended. PELVIC: deferred MUSCULOSKELETAL: no calf tenderness bilaterally EXT: no edema bilaterally, normal pulses  Labs: Results for orders placed or performed during the hospital encounter of 09/05/23 (from the past 336 hour(s))  Hemoglobin A1c   Collection Time: 09/05/23  9:46 AM  Result Value Ref Range   Hgb A1c MFr Bld 7.3 (H) 4.8 - 5.6 %   Mean Plasma Glucose 162.81 mg/dL  CBC    Collection Time: 09/05/23  9:46 AM  Result Value Ref Range   WBC 5.2 4.0 - 10.5 K/uL   RBC 5.25 (H) 3.87 - 5.11 MIL/uL   Hemoglobin 12.8 12.0 - 15.0 g/dL   HCT 16.1 09.6 - 04.5 %   MCV 76.8 (L) 80.0 - 100.0 fL   MCH 24.4 (L) 26.0 - 34.0 pg   MCHC 31.8 30.0 - 36.0 g/dL   RDW 40.9 (H) 81.1 - 91.4 %   Platelets 319 150 - 400 K/uL   nRBC 0.0 0.0 - 0.2 %  Comprehensive metabolic panel   Collection Time: 09/05/23  9:46 AM  Result Value Ref Range   Sodium 135 135 - 145 mmol/L   Potassium 3.6 3.5 - 5.1 mmol/L   Chloride 100 98 - 111 mmol/L   CO2 24 22 - 32 mmol/L   Glucose, Bld 116 (H) 70 - 99 mg/dL   BUN 8 8 - 23 mg/dL   Creatinine, Ser 7.82 0.44 - 1.00 mg/dL   Calcium 9.0 8.9 - 95.6 mg/dL   Total Protein 7.2 6.5 - 8.1 g/dL   Albumin 3.6 3.5 - 5.0 g/dL   AST 17 15 - 41 U/L   ALT 13 0 - 44 U/L   Alkaline Phosphatase 56 38 - 126 U/L   Total Bilirubin 0.4 0.3 - 1.2 mg/dL   GFR, Estimated >21 >30 mL/min   Anion gap 11 5 - 15    Imaging Studies: 07/2023: Uterus: 6.4 x 4.7 x 2.8 cm = volume: 44 mL. No fibroids or other mass visualized. 9mm thickened endometrium- no focal abnormality.  Ovaries not visualized  Assessment: Postmenopausal bleeding Endometrial polyp  Plan: Hysteroscopy, D&C, possible myosure polypectomy -NPO -LR @ 125cc/hr -SCDs to OR -Risk/benefits and alternatives reviewed with the patient including but not limited to risk of bleeding, infection and injury to surrounding organs, which may require further surgical intervention.  Questions and concerns were addressed and pt desires to proceed  Myna Hidalgo, DO Attending Obstetrician & Gynecologist, [facility] for [facility], [facility]

## 2023-09-09 ENCOUNTER — Ambulatory Visit (HOSPITAL_COMMUNITY): Payer: PRIVATE HEALTH INSURANCE | Admitting: Anesthesiology

## 2023-09-09 ENCOUNTER — Encounter (HOSPITAL_COMMUNITY): Payer: Self-pay | Admitting: Obstetrics & Gynecology

## 2023-09-09 ENCOUNTER — Ambulatory Visit (HOSPITAL_COMMUNITY)
Admission: RE | Admit: 2023-09-09 | Discharge: 2023-09-09 | Disposition: A | Discharge status: Home / Self Care | Payer: PRIVATE HEALTH INSURANCE | Attending: Obstetrics & Gynecology | Admitting: Obstetrics & Gynecology

## 2023-09-09 ENCOUNTER — Encounter (HOSPITAL_COMMUNITY)
Admission: RE | Disposition: A | Discharge status: Home / Self Care | Payer: Self-pay | Source: Home / Self Care | Attending: Obstetrics & Gynecology

## 2023-09-09 DIAGNOSIS — E119 Type 2 diabetes mellitus without complications: Secondary | ICD-10-CM | POA: Diagnosis not present

## 2023-09-09 DIAGNOSIS — Z833 Family history of diabetes mellitus: Secondary | ICD-10-CM | POA: Diagnosis not present

## 2023-09-09 DIAGNOSIS — N840 Polyp of corpus uteri: Secondary | ICD-10-CM | POA: Insufficient documentation

## 2023-09-09 DIAGNOSIS — N950 Postmenopausal bleeding: Secondary | ICD-10-CM

## 2023-09-09 DIAGNOSIS — I10 Essential (primary) hypertension: Secondary | ICD-10-CM | POA: Insufficient documentation

## 2023-09-09 DIAGNOSIS — Z8249 Family history of ischemic heart disease and other diseases of the circulatory system: Secondary | ICD-10-CM | POA: Insufficient documentation

## 2023-09-09 DIAGNOSIS — Z7984 Long term (current) use of oral hypoglycemic drugs: Secondary | ICD-10-CM | POA: Insufficient documentation

## 2023-09-09 DIAGNOSIS — K219 Gastro-esophageal reflux disease without esophagitis: Secondary | ICD-10-CM | POA: Insufficient documentation

## 2023-09-09 DIAGNOSIS — F17200 Nicotine dependence, unspecified, uncomplicated: Secondary | ICD-10-CM | POA: Diagnosis not present

## 2023-09-09 HISTORY — PX: HYSTEROSCOPY WITH D & C: SHX1775

## 2023-09-09 HISTORY — PX: POLYPECTOMY: SHX5525

## 2023-09-09 LAB — GLUCOSE, CAPILLARY
Glucose-Capillary: 127 mg/dL — ABNORMAL HIGH (ref 70–99)
Glucose-Capillary: 108 mg/dL — ABNORMAL HIGH (ref 70–99)

## 2023-09-09 SURGERY — DILATATION AND CURETTAGE /HYSTEROSCOPY
Anesthesia: General | Site: Vagina

## 2023-09-09 MED ORDER — LACTATED RINGERS IV SOLN
INTRAVENOUS | Status: DC
  Administered 2023-09-09: 1000 mL via INTRAVENOUS

## 2023-09-09 MED ORDER — LIDOCAINE-EPINEPHRINE 0.5 %-1:200000 IJ SOLN
INTRAMUSCULAR | Status: DC | PRN
  Administered 2023-09-09: 20 mL

## 2023-09-09 MED ORDER — ORAL CARE MOUTH RINSE: 15.0000 mL | Freq: Once | OROMUCOSAL | Status: DC

## 2023-09-09 MED ORDER — ONDANSETRON HCL 4 MG/2ML IJ SOLN: 4.0000 mg | Freq: Once | INTRAMUSCULAR | Status: DC | PRN

## 2023-09-09 MED ORDER — SODIUM CHLORIDE 0.9 % IR SOLN
Status: DC | PRN
  Administered 2023-09-09: 1000 mL

## 2023-09-09 MED ORDER — OXYCODONE HCL 5 MG/5ML PO SOLN: 5.0000 mg | Freq: Once | ORAL | Status: DC | PRN

## 2023-09-09 MED ORDER — MIDAZOLAM HCL 2 MG/2ML IJ SOLN
INTRAMUSCULAR | Status: DC | PRN
  Administered 2023-09-09: 2 mg via INTRAVENOUS

## 2023-09-09 MED ORDER — POVIDONE-IODINE 10 % EX SWAB: 2.0000 | Freq: Once | CUTANEOUS | Status: DC

## 2023-09-09 MED ORDER — PROPOFOL 10 MG/ML IV BOLUS
INTRAVENOUS | Status: DC | PRN
  Administered 2023-09-09: 150 mg via INTRAVENOUS

## 2023-09-09 MED ORDER — LACTATED RINGERS IV SOLN: INTRAVENOUS | Status: DC

## 2023-09-09 MED ORDER — MIDAZOLAM HCL 2 MG/2ML IJ SOLN
INTRAMUSCULAR | Status: AC
  Filled 2023-09-09: qty 2

## 2023-09-09 MED ORDER — PROPOFOL 10 MG/ML IV BOLUS
INTRAVENOUS | Status: AC
  Filled 2023-09-09: qty 20

## 2023-09-09 MED ORDER — FENTANYL CITRATE (PF) 100 MCG/2ML IJ SOLN
INTRAMUSCULAR | Status: DC | PRN
Start: 2023-09-09 — End: 2023-09-09
  Administered 2023-09-09: 25 ug via INTRAVENOUS
  Administered 2023-09-09: 50 ug via INTRAVENOUS
  Administered 2023-09-09: 25 ug via INTRAVENOUS

## 2023-09-09 MED ORDER — CHLORHEXIDINE GLUCONATE 0.12 % MT SOLN: 15.0000 mL | Freq: Once | OROMUCOSAL | Status: DC

## 2023-09-09 MED ORDER — OXYCODONE HCL 5 MG PO TABS: 5.0000 mg | ORAL_TABLET | Freq: Once | ORAL | Status: DC | PRN

## 2023-09-09 MED ORDER — LIDOCAINE-EPINEPHRINE 0.5 %-1:200000 IJ SOLN
INTRAMUSCULAR | Status: AC
  Filled 2023-09-09: qty 50

## 2023-09-09 MED ORDER — LIDOCAINE HCL (CARDIAC) PF 100 MG/5ML IV SOSY
PREFILLED_SYRINGE | INTRAVENOUS | Status: DC | PRN
  Administered 2023-09-09: 60 mg via INTRAVENOUS

## 2023-09-09 MED ORDER — FENTANYL CITRATE PF 50 MCG/ML IJ SOSY: 25.0000 ug | PREFILLED_SYRINGE | INTRAMUSCULAR | Status: DC | PRN

## 2023-09-09 MED ORDER — ALBUTEROL SULFATE HFA 108 (90 BASE) MCG/ACT IN AERS
INHALATION_SPRAY | RESPIRATORY_TRACT | Status: AC
  Filled 2023-09-09: qty 6.7

## 2023-09-09 MED ORDER — DEXAMETHASONE SODIUM PHOSPHATE 10 MG/ML IJ SOLN
INTRAMUSCULAR | Status: DC | PRN
  Administered 2023-09-09: 10 mg via INTRAVENOUS

## 2023-09-09 MED ORDER — ONDANSETRON HCL 4 MG/2ML IJ SOLN
INTRAMUSCULAR | Status: DC | PRN
  Administered 2023-09-09: 4 mg via INTRAVENOUS

## 2023-09-09 MED ORDER — FENTANYL CITRATE (PF) 100 MCG/2ML IJ SOLN
INTRAMUSCULAR | Status: AC
  Filled 2023-09-09: qty 2

## 2023-09-09 MED ORDER — ALBUTEROL SULFATE HFA 108 (90 BASE) MCG/ACT IN AERS
INHALATION_SPRAY | RESPIRATORY_TRACT | Status: DC | PRN
Start: 2023-09-09 — End: 2023-09-09
  Administered 2023-09-09: 4 via RESPIRATORY_TRACT

## 2023-09-09 SURGICAL SUPPLY — 21 items
CLOTH BEACON ORANGE TIMEOUT ST (SAFETY) ×2 IMPLANT
COVER LIGHT HANDLE STERIS (MISCELLANEOUS) ×6 IMPLANT
DEVICE MYOSURE REACH (MISCELLANEOUS) IMPLANT
GAUZE 4X4 16PLY ~~LOC~~+RFID DBL (SPONGE) ×4 IMPLANT
GLOVE BIO SURGEON STRL SZ 6.5 (GLOVE) ×2 IMPLANT
GLOVE BIO SURGEON STRL SZ7 (GLOVE) IMPLANT
GLOVE BIOGEL PI IND STRL 7.0 (GLOVE) ×6 IMPLANT
GOWN STRL REUS W/ TWL LRG LVL3 (GOWN DISPOSABLE) ×2 IMPLANT
GOWN STRL REUS W/TWL LRG LVL3 (GOWN DISPOSABLE) ×3 IMPLANT
IV NS IRRIG 3000ML ARTHROMATIC (IV SOLUTION) ×2 IMPLANT
KIT PROCEDURE FLUENT (KITS) ×2 IMPLANT
KIT TURNOVER CYSTO (KITS) ×2 IMPLANT
KIT TURNOVER KIT A (KITS) ×2 IMPLANT
NS IRRIG 1000ML POUR BTL (IV SOLUTION) ×2 IMPLANT
PACK PERI GYN (CUSTOM PROCEDURE TRAY) ×2 IMPLANT
PAD ARMBOARD 7.5X6 YLW CONV (MISCELLANEOUS) ×2 IMPLANT
POSITIONER HEAD 8X9X4 ADT (SOFTGOODS) ×2 IMPLANT
SEAL ROD LENS SCOPE MYOSURE (ABLATOR) ×2 IMPLANT
SET BASIN LINEN APH (SET/KITS/TRAYS/PACK) ×2 IMPLANT
SOL PREP POV-IOD 4OZ 10% (MISCELLANEOUS) ×2 IMPLANT
SYR CONTROL 10ML LL (SYRINGE) ×2 IMPLANT

## 2023-09-09 NOTE — Anesthesia Postprocedure Evaluation (Signed)
Anesthesia Post Note  Patient: [name]  Procedure(s) Performed: DILATATION AND CURETTAGE /HYSTEROSCOPY (Vagina ) POLYPECTOMY (Vagina )  Patient location during evaluation: PACU Anesthesia Type: General Level of consciousness: awake and alert Pain management: pain level controlled Vital Signs Assessment: post-procedure vital signs reviewed and stable Respiratory status: spontaneous breathing, nonlabored ventilation, respiratory function stable and patient connected to nasal cannula oxygen Cardiovascular status: blood pressure returned to baseline and stable Postop Assessment: no apparent nausea or vomiting Anesthetic complications: no   There were no known notable events for this encounter.   Last Vitals:  Vitals:   09/09/23 1154 09/09/23 1432  BP: (!) 142/79 (!) 166/73  Pulse: 61 (!) 56  Resp: 16 17  Temp: 36.6 C (!) 36.3 C  SpO2: 100%     Last Pain:  Vitals:   09/09/23 1154  TempSrc: Oral  PainSc: 0-No pain                 Tashea Othman L Galileah Piggee

## 2023-09-09 NOTE — Discharge Instructions (Signed)
HOME INSTRUCTIONS  Please note any unusual or excessive bleeding, pain, swelling. Mild dizziness or drowsiness are normal for about 24 hours after surgery.   Shower when comfortable  Restrictions: No driving for 24 hours or while taking pain medications.  Activity:  No heavy lifting (> 20 lbs), nothing in vagina (no tampons, douching, or intercourse) x 2 weeks; no tub baths for 2 weeks Vaginal spotting is expected but if your bleeding is heavy, period like,  please call the office   Diet:  You may return to your regular diet.  Do not eat large meals.  Eat small frequent meals throughout the day.  Continue to drink a good amount of water at least 6-8 glasses of water per day, hydration is very important for the healing process.  Pain Management: Take over the counter tylenol or ibuprofen as needed for pain.  You can either take one or alternate between the two medications for pain management.  You may also use a heating pack as needed.    Alcohol -- Avoid for 24 hours and while taking pain medications.  Nausea: Take sips of ginger ale or soda  Fever -- Call physician if temperature over 101 degrees  Follow up:  A follow up appointment is not indicated. If you experience fever (a temperature greater than 100.4), pain unrelieved by pain medication, shortness of breath, swelling of a single leg, considerable vaginal bleeding, or any other symptoms which are concerning to you please the office immediately.

## 2023-09-09 NOTE — Transfer of Care (Signed)
Immediate Anesthesia Transfer of Care Note  Patient: [name]  Procedure(s) Performed: DILATATION AND CURETTAGE /HYSTEROSCOPY (Vagina ) POLYPECTOMY (Vagina )  Patient Location: PACU  Anesthesia Type:General  Level of Consciousness: drowsy  Airway & Oxygen Therapy: Patient Spontanous Breathing and Patient connected to face mask oxygen  Post-op Assessment: Report given to RN and Post -op Vital signs reviewed and stable  Post vital signs: Reviewed and stable  Last Vitals:  Vitals Value Taken Time  BP 166/73 09/09/23 1432  Temp 97.4   Pulse 61   Resp 18 09/09/23 1434  SpO2 100%   Vitals shown include unfiled device data.  Last Pain:  Vitals:   09/09/23 1154  TempSrc: Oral  PainSc: 0-No pain         Complications: No notable events documented.

## 2023-09-09 NOTE — Anesthesia Postprocedure Evaluation (Deleted)
Anesthesia Post Note  Patient: [name]  Procedure(s) Performed: DILATATION AND CURETTAGE /HYSTEROSCOPY POLYPECTOMY  Patient location during evaluation: PACU Anesthesia Type: General Level of consciousness: awake and alert Pain management: pain level controlled Vital Signs Assessment: post-procedure vital signs reviewed and stable Respiratory status: spontaneous breathing, nonlabored ventilation, respiratory function stable and patient connected to nasal cannula oxygen Cardiovascular status: blood pressure returned to baseline and stable Postop Assessment: no apparent nausea or vomiting Anesthetic complications: no   There were no known notable events for this encounter.   Last Vitals:  Vitals:   09/09/23 1154  BP: (!) 142/79  Pulse: 61  Resp: 16  Temp: 36.6 C  SpO2: 100%    Last Pain:  Vitals:   09/09/23 1154  TempSrc: Oral  PainSc: 0-No pain                 Elania Crowl L Kristell Wooding

## 2023-09-09 NOTE — Op Note (Signed)
Operative Report  PreOp: 1) postmenopausal bleeding, endometrial polyp PostOp: same Procedure:  Hysteroscopy, Dilation and Curettage, polypectomy Surgeon: Dr. Myna Hidalgo Anesthesia: General Complications:none EBL: Minimal IVF:750cc  Findings: 6cm uterus with 1-2cm uterine polyp.  Both ostia visualized.  Specimens: 1) endometrial curettings with uterine polyp  Procedure: The patient was taken to the operating room where she underwent general anesthesia without difficulty. The patient was placed in a low lithotomy position using Allen stirrups. She was then prepped and draped in the normal sterile fashion. Sterile speculum was placed.  A single tooth tenaculum was placed on the anterior lip of the cervix. Cervical block was completed using 0.5% lidocaine with epinephrine.    The uterus was then sounded to 6cm. The endocervical canal was then serially dilated to 14French using Hank dilators to accommodate the hydrothermal ablation hysteroscopic apparatus.  The hysteroscope was inserted and findings were visualized as noted above.  The myosure was then used for resection of the uterine polyp as well as curettage of the endometrial lining.  The hysteroscope was removed under direct visualization.  All instrument were then removed. Hemostasis was observed at the cervical site. The patient was repositioned to the supine position. The patient tolerated the procedure without any complications and taken to recovery in stable condition.   Myna Hidalgo, DO Attending Obstetrician & Gynecologist, [facility] for [employer], [facility]

## 2023-09-09 NOTE — Anesthesia Preprocedure Evaluation (Signed)
Anesthesia Evaluation  Patient identified by MRN, date of birth, ID band Patient awake    Reviewed: Allergy & Precautions, H&P , NPO status , Patient's Chart, lab work & pertinent test results  Airway Mallampati: II       Dental  (+) Edentulous Upper   Pulmonary Current Smoker and Patient abstained from smoking.   Pulmonary exam normal breath sounds clear to auscultation       Cardiovascular hypertension, Pt. on medications Normal cardiovascular exam Rhythm:Regular     Neuro/Psych    GI/Hepatic ,GERD  Medicated and Controlled,,  Endo/Other  diabetes, Well Controlled, Type 2, Oral Hypoglycemic Agents    Renal/GU      Musculoskeletal   Abdominal Normal abdominal exam  (+)   Peds  Hematology   Anesthesia Other Findings   Reproductive/Obstetrics                             Anesthesia Physical Anesthesia Plan  ASA: 3  Anesthesia Plan: General   Post-op Pain Management:    Induction: Intravenous, Rapid sequence and Cricoid pressure planned  PONV Risk Score and Plan: Ondansetron and Dexamethasone  Airway Management Planned: LMA  Additional Equipment:   Intra-op Plan:   Post-operative Plan:   Informed Consent: I have reviewed the patients History and Physical, chart, labs and discussed the procedure including the risks, benefits and alternatives for the proposed anesthesia with the patient or authorized representative who has indicated his/her understanding and acceptance.     Dental advisory given  Plan Discussed with: CRNA  Anesthesia Plan Comments:         Anesthesia Quick Evaluation

## 2023-09-15 ENCOUNTER — Telehealth: Payer: Self-pay | Admitting: Obstetrics & Gynecology

## 2023-09-15 LAB — SURGICAL PATHOLOGY

## 2023-09-15 NOTE — Telephone Encounter (Signed)
Called with results of negative pathology F/u in 55yr for annual or sooner with any issues  Myna Hidalgo, DO Attending Obstetrician & Gynecologist, [facility] [employer], [facility]

## 2023-09-16 ENCOUNTER — Encounter (HOSPITAL_COMMUNITY): Payer: Self-pay | Admitting: Obstetrics & Gynecology
